# Patient Record
Sex: Male | Born: 1995 | Race: Black or African American | Hispanic: No | Marital: Single | Smoking: Never smoker
Health system: Southern US, Community
[De-identification: ages and names within clinical notes are randomized; demographics above are authoritative.]

## PROBLEM LIST (undated history)

## (undated) HISTORY — PX: OTHER SURGICAL HISTORY: SHX169

---
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
## 2019-08-25 ENCOUNTER — Other Ambulatory Visit: Payer: Self-pay

## 2019-08-25 DIAGNOSIS — Z20822 Contact with and (suspected) exposure to covid-19: Secondary | ICD-10-CM

## 2019-08-26 LAB — NOVEL CORONAVIRUS, NAA: SARS-CoV-2, NAA: DETECTED — AB

## 2019-08-27 ENCOUNTER — Ambulatory Visit: Payer: Self-pay

## 2019-08-27 NOTE — Telephone Encounter (Signed)
Provided covid-19 lab results to Patient, Provided care advice related to Covidd-19 Dx.  Patient voiced Understanding.

## 2019-08-29 ENCOUNTER — Telehealth: Payer: Self-pay | Admitting: Critical Care Medicine

## 2019-08-29 NOTE — Telephone Encounter (Signed)
The patient has a positive COVID test has been symptomatic for about 2 weeks and was tested on the end of his symptom complex.  The patient's rapidly improving and knows he needs to stay in isolation at least until 15 September

## 2020-07-25 ENCOUNTER — Ambulatory Visit (INDEPENDENT_AMBULATORY_CARE_PROVIDER_SITE_OTHER): Payer: 59 | Admitting: Licensed Clinical Social Worker

## 2020-07-25 ENCOUNTER — Other Ambulatory Visit: Payer: Self-pay

## 2020-07-25 DIAGNOSIS — F4321 Adjustment disorder with depressed mood: Secondary | ICD-10-CM

## 2020-07-25 DIAGNOSIS — F338 Other recurrent depressive disorders: Secondary | ICD-10-CM | POA: Diagnosis not present

## 2020-07-26 NOTE — Progress Notes (Signed)
Comprehensive Clinical Assessment (CCA) Note  07/26/2020 Levi Rogers [id number]  Visit Diagnosis:      ICD-10-CM   1. 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 Grief  F43.21      CCA Biopsychosocial  Intake/Chief Complaint:  CCA Intake With Chief Complaint CCA Part Two Date: 07/25/20 CCA Part Two Time: 0953 Chief Complaint/Presenting Problem: uncontrollable urges, transitions Patient's Currently Reported Symptoms/Problems: Mood: irritability, concerned about weight but limited appetite, some difficulty with concentration, limited friends, lack of motivation at times, increase in sexual thoughts and urges, difficulty transitioning to adulthood, feelings of worthlessness, some feelings of hopelessness, doesn't feel like he is a happy person, feels empty at times Individual's Strengths: hardworker, likes to help out, good in Retail buyer and psychology, works well with others Individual's Preferences: prefers peace and quiet, prefers to work by himself, doesn't prefer to be around people too much Individual's Abilities: Good in Retail buyer, is a Production designer, theatre/television/film at work Type of Services Patient Feels Are Needed: Therapy Initial Clinical Notes/Concerns: Symptoms started in middle school when he was bullied about weight, symptoms occur daily for impulses and every few days for "hard depression," symptoms are moderate to severe  Mental Health Symptoms Depression:  Depression: Irritability, Difficulty Concentrating, Worthlessness, Hopelessness  Mania:   N/A  Anxiety:   Anxiety: Irritability, Worrying  Psychosis:  Psychosis: None  Trauma:  Trauma: None  Obsessions:  Obsessions: None  Compulsions:  Compulsions: None  Inattention:  Inattention: None  Hyperactivity/Impulsivity:  Hyperactivity/Impulsivity: N/A  Oppositional/Defiant Behaviors:  Oppositional/Defiant Behaviors: None  Emotional Irregularity:  Emotional Irregularity: None  Other Mood/Personality Symptoms:  Other Mood/Personality  Symptoms: N/A   Mental Status Exam Appearance and self-care  Stature:  Stature: Average  Weight:  Weight: Overweight  Clothing:  Clothing: Casual  Grooming:  Grooming: Normal  Cosmetic use:  Cosmetic Use: None  Posture/gait:  Posture/Gait: Normal  Motor activity:  Motor Activity: Not Remarkable  Sensorium  Attention:  Attention: Normal  Concentration:  Concentration: Normal  Orientation:  Orientation: X5  Recall/memory:  Recall/Memory: Normal  Affect and Mood  Affect:  Affect: Appropriate  Mood:  Mood: Depressed  Relating  Eye contact:  Eye Contact: Fleeting  Facial expression:  Facial Expression: Depressed, Responsive  Attitude toward examiner:  Attitude Toward Examiner: Cooperative  Thought and Language  Speech flow: Speech Flow: Normal  Thought content:  Thought Content: Appropriate to Mood and Circumstances  Preoccupation:   N/A  Hallucinations:  Hallucinations: None  Organization:   Logical  Company secretary of Knowledge:  Fund of Knowledge: Good  Intelligence:  Intelligence: Average  Abstraction:  Abstraction: Normal  Judgement:  Judgement: Fair  Dance movement psychotherapist:  Reality Testing: Adequate  Insight:  Insight: Good  Decision Making:  Decision Making: Normal  Social Functioning  Social Maturity:  Social Maturity: Isolates  Social Judgement:  Social Judgement: Normal  Stress  Stressors:  Stressors: Transitions  Coping Ability:  Coping Ability: Building surveyor Deficits:  Skill Deficits: Self-control  Supports:  Supports: Family     Religion: Religion/Spirituality Are You A Religious Person?: Yes What is Your Religious Affiliation?: Christian How Might This Affect Treatment?: Support in treatment  Leisure/Recreation: Leisure / Recreation Do You Have Hobbies?: Yes Leisure and Hobbies: video games, youtube  Exercise/Diet: Exercise/Diet Do You Exercise?: No Have You Gained or Lost A Significant Amount of Weight in the Past Six Months?: No Do  You Follow a Special Diet?: No Do You Have Any Trouble Sleeping?: No   CCA Employment/Education  Employment/Work Situation: Employment / Work Situation Employment situation: Employed Where is patient currently employed?: [employer] How long has patient been employed?: 6 years Patient's job has been impacted by current illness: No What is the longest time patient has a held a job?: 6 years Where was the patient employed at that time?: [employer] Has patient ever been in the [employer]?: No  Education: Education Is Patient Currently Attending School?: No Last Grade Completed: 12 Name of High School: [employer] Did Garment/textile technologist From [employer]?: Yes Did Theme park manager?:  (Some college) Did Secretary/administrator School?: No Did You Have Any Special Interests In School?: Science, and Psychology Did You Have An Individualized Education Program (IIEP): Yes (seperate rooms for testing) Did You Have Any Difficulty At School?: Yes Were Any Medications Ever Prescribed For These Difficulties?: No Patient's Education Has Been Impacted by Current Illness: No   CCA Family/Childhood History  Family and Relationship History: Family history Marital status: Single Are you sexually active?: No What is your sexual orientation?: Heterosexual Has your sexual activity been affected by drugs, alcohol, medication, or emotional stress?: N/A Does patient have children?: No  Childhood History:  Childhood History By whom was/is the patient raised?: Both parents Additional childhood history information: Both parents were in the home. Father passed away from covid19 last year. Patient describes childhood as good until middle school and then it got bad due to bullying Description of patient's relationship with caregiver when they were a child: Mother: Good, Father: good but got stressful at times Patient's description of current relationship with people who raised him/her: Mother: limited, Father:  deceased How were you disciplined when you got in trouble as a child/adolescent?: spanked a few times Does patient have siblings?: Yes Number of Siblings: 1 Description of patient's current relationship with siblings: Brother, pretty good Did patient suffer any verbal/emotional/physical/sexual abuse as a child?: No Did patient suffer from severe childhood neglect?: No Has patient ever been sexually abused/assaulted/raped as an adolescent or adult?: No Was the patient ever a victim of a crime or a disaster?: No Witnessed domestic violence?: No Has patient been affected by domestic violence as an adult?: No  Child/Adolescent Assessment:     CCA Substance Use  Alcohol/Drug Use: Alcohol / Drug Use Pain Medications: See patient MAR Prescriptions: See patient MAR Over the Counter: See patient MAR History of alcohol / drug use?: No history of alcohol / drug abuse                         ASAM's:  Six Dimensions of Multidimensional Assessment  Dimension 1:  Acute Intoxication and/or Withdrawal Potential:   Dimension 1:  Description of individual's past and current experiences of substance use and withdrawal: None  Dimension 2:  Biomedical Conditions and Complications:   Dimension 2:  Description of patient's biomedical conditions and  complications: None  Dimension 3:  Emotional, Behavioral, or Cognitive Conditions and Complications:  Dimension 3:  Description of emotional, behavioral, or cognitive conditions and complications: None  Dimension 4:  Readiness to Change:  Dimension 4:  Description of Readiness to Change criteria: None  Dimension 5:  Relapse, Continued use, or Continued Problem Potential:  Dimension 5:  Relapse, continued use, or continued problem potential critiera description: None  Dimension 6:  Recovery/Living Environment:  Dimension 6:  Recovery/Iiving environment criteria description: None  ASAM Severity Score: ASAM's Severity Rating Score: 0  ASAM  Recommended Level of Treatment:     Substance  use Disorder (SUD)    Recommendations for Services/Supports/Treatments: Recommendations for Services/Supports/Treatments Recommendations For Services/Supports/Treatments: Individual Therapy, Medication Management  DSM5 Diagnoses: There are no problems to display for this patient.   Patient Centered Plan: Patient is on the following Treatment Plan(s):  Depression   Referrals to Alternative Service(s): Referred to Alternative Service(s):   Place:   Date:   Time:    Referred to Alternative Service(s):   Place:   Date:   Time:    Referred to Alternative Service(s):   Place:   Date:   Time:    Referred to Alternative Service(s):   Place:   Date:   Time:     Bynum Bellows, LCSW

## 2020-08-16 ENCOUNTER — Other Ambulatory Visit: Payer: Self-pay

## 2020-08-16 ENCOUNTER — Ambulatory Visit (INDEPENDENT_AMBULATORY_CARE_PROVIDER_SITE_OTHER): Payer: 59 | Admitting: Licensed Clinical Social Worker

## 2020-08-16 DIAGNOSIS — F338 Other recurrent depressive disorders: Secondary | ICD-10-CM

## 2020-08-17 NOTE — Progress Notes (Signed)
   THERAPIST PROGRESS NOTE  Session Time: 10:00 am-10:45 am  Participation Level: Active  Behavioral Response: CasualAlertDepressed  Type of Therapy: Individual Therapy  Treatment Goals addressed: Coping  Interventions: CBT and Solution Focused  Summary: Levi Rogers is a 24 y.o. male who presents oriented x5 (person, place, situation, time, and object), casually dressed, appropriately groomed, average height, overweight, and cooperative to address mood. Patient has minimal history of medical and mental health treatment. Patient denies suicidal and homicidal ideations. Patient denies psychosis including auditory and visual hallucinations. Patient denies substance abuse. Patient is at low risk for lethality. Patient has a history of low self esteem and isolation but symptoms increased after his father passed away in 04-10-2019 due to complications from COVID19.  Session #1  Physically: Patient is doing ok physically. He has not been exercising. Patient agreed to start walking twice a week at a place he used to go with his family. Patient also agreed to either pack his lunch or find a healthy option to eat when he is at work.  Spiritually/values: No issues identified.  Relationships: Patient has not been interacting with his family a lot. He wants to change that. He feels like he has isolated more since his father passed. Patient feels like a lot of the things he used to do with his father he has lost interest in such as watching anime and eating healthy. Patient doesn't have friends and has never gone on a date. He works a lot and is not always in a position to meet new people.  Emotionally/Mentally/Behavior: Patient's mood is down. He is still not talking to his family like he used to. Patient also noted that with visiting cam sites it provides him a feeling of happiness due to attention/connection that he doesn't get from family. Patient agreed to spend less on cam sites. He was spending almost  $300 each paycheck on cam sites. He feels guilty after and recognizes that he uses a lot of money on these sites with nothing to show for it. Patient is planning on asking his family to go out and go shopping so that he can buy new clothes. This is something that he hasn't done in a long time. Patient also admitted that he feels guilt to feeling like he brought COVID into his home which eventually caused his father to pass away. He hasn't shared this with his family. Patient said that his family would tell him it is not his fault but he still carries some guilt.   Patient engaged in session. He responded well to interventions. Patient continues to meet criteria for Other recurrent depressive disorders and grief. Patient will continue in outpatient therapy due to being the least restrictive service to meet his needs. Patient made minimal progress on his goals.   Suicidal/Homicidal: Negativewithout intent/plan  Therapist Response: Therapist reviewed patient's thoughts and behaviors. Therapist utilized CBT to address mood. Therapist processed patient's feelings to identify triggers for mood and grief. Therapist assisted patient in identifying small steps to take to improve mood including improving physical health, being more social and doing something healthy for himself, and limiting the amount of money that he spends on cam sites to less than $100,  Plan: Return again in 1 weeks.  Diagnosis: Axis I: Other recurrent depressive disorders    Axis II: No diagnosis    Bynum Bellows, LCSW 08/17/2020

## 2020-08-30 ENCOUNTER — Other Ambulatory Visit: Payer: Self-pay

## 2020-08-30 ENCOUNTER — Ambulatory Visit (INDEPENDENT_AMBULATORY_CARE_PROVIDER_SITE_OTHER): Payer: 59 | Admitting: Licensed Clinical Social Worker

## 2020-08-30 DIAGNOSIS — F4321 Adjustment disorder with depressed mood: Secondary | ICD-10-CM | POA: Diagnosis not present

## 2020-08-30 DIAGNOSIS — F338 Other recurrent depressive disorders: Secondary | ICD-10-CM | POA: Diagnosis not present

## 2020-08-30 NOTE — Progress Notes (Signed)
° °  THERAPIST PROGRESS NOTE  Session Time: 9:00 am-9:45 am  Participation Level: Active  Behavioral Response: CasualAlertDepressed  Type of Therapy: Individual Therapy  Treatment Goals addressed: Coping  Interventions: CBT and Solution Focused  Summary: Levi Rogers is a 24 y.o. male who presents oriented x5 (person, place, situation, time, and object), casually dressed, appropriately groomed, average height, overweight, and cooperative to address mood. Patient has minimal history of medical and mental health treatment. Patient denies suicidal and homicidal ideations. Patient denies psychosis including auditory and visual hallucinations. Patient denies substance abuse. Patient is at low risk for lethality. Patient has a history of low self esteem and isolation but symptoms increased after his father passed away in 04/23/2019 due to complications from COVID19.  Session #2  Physically: Patient is doing ok physically. He did not go walking yet. He has been working on cleaning up his eating. Patient still plans to start walking.   Spiritually/values: No issues identified.  Relationships: Patient went to the movies with his brother. He had a good time and plans to do more of it. This week was the anniversary of his father's death. He didn't really feel much. He felt bad about not feeling anything. He felt like he didn't have as much connection with his father as his mother or brother did. He did have some things in common with his father but it was different than his other family members.   Emotionally/Mentally/Behavior: Patient's mood has been ok. He went to cam sites less and felt good about that. Patient was busy at work and has some stress related to work issues but he was able to manage it.    Patient engaged in session. He responded well to interventions. Patient continues to meet criteria for Other recurrent depressive disorders and grief. Patient will continue in outpatient therapy due to  being the least restrictive service to meet his needs. Patient made minimal progress on his goals.   Suicidal/Homicidal: Negativewithout intent/plan  Therapist Response: Therapist reviewed patient's thoughts and behaviors. Therapist utilized CBT to address mood. Therapist processed patient's feelings to identify triggers for mood and grief. Therapist assisted patient in identifying what has gone well since the last session, and recommitted to start getting physically healthy and continuing to be social.   Plan: Return again in 1 weeks.  Diagnosis: Axis I: Other recurrent depressive disorders    Axis II: No diagnosis    Bynum Bellows, LCSW 08/30/2020

## 2020-09-06 ENCOUNTER — Other Ambulatory Visit: Payer: Self-pay

## 2020-09-06 ENCOUNTER — Ambulatory Visit (INDEPENDENT_AMBULATORY_CARE_PROVIDER_SITE_OTHER): Payer: 59 | Admitting: Licensed Clinical Social Worker

## 2020-09-06 DIAGNOSIS — F338 Other recurrent depressive disorders: Secondary | ICD-10-CM

## 2020-09-07 NOTE — Progress Notes (Signed)
Virtual Visit via Video Note  I connected with Levi Rogers on 09/07/20 at 10:00 AM EDT by a video enabled telemedicine application and verified that I am speaking with the correct person using two identifiers.  Location: Patient: home Provider: Office   I discussed the limitations of evaluation and management by telemedicine and the availability of in person appointments. The patient expressed understanding and agreed to proceed.  THERAPIST PROGRESS NOTE  Session Time: 10:00 am-10:45 am  Participation Level: Active  Behavioral Response: CasualAlertDepressed  Type of Therapy: Individual Therapy  Treatment Goals addressed: Coping  Interventions: CBT and Solution Focused  Case Summary: Levi Rogers is a 24 y.o. male who presents oriented x5 (person, place, situation, time, and object), casually dressed, appropriately groomed, average height, overweight, and cooperative to address mood. Patient has minimal history of medical and mental health treatment. Patient denies suicidal and homicidal ideations. Patient denies psychosis including auditory and visual hallucinations. Patient denies substance abuse. Patient is at low risk for lethality. Patient has a history of low self esteem and isolation but symptoms increased after his father passed away in Mar 28, 2019 due to complications from COVID19.  Session #3  Physically: Patient is doing ok physically. He continues to work on his diet and improving his eating. His sleep has been ok.   Spiritually/values: No issues identified.  Relationships: Patient feels disconnected from others. He doesn't feel like he has many close relationships. Patient feels like his family relationships are good but he desires relationships outside of that. Patient is still visiting cam sites but he is spending less money on them. He feels lonely at night at times. Patient agreed to work on interacting with his established relationships more.   Emotionally/Mentally/Behavior: Patient's mood has been ok. Patient has been busy with work. This has been beneficial for him due to having less time to spend ruminating on his thoughts. Patient has reflected on whether he could be the "man of the house." He feels like the "man of the house" needs to know how to fix things, be there for others, and give advice. He doesn't feel like he could be that but after discussion realized that he could learn to do those things such as his father had to learn them.   Patient engaged in session. He responded well to interventions. Patient continues to meet criteria for Other recurrent depressive disorders and grief. Patient will continue in outpatient therapy due to being the least restrictive service to meet his needs. Patient made minimal progress on his goals.   Suicidal/Homicidal: Negativewithout intent/plan  Therapist Response: Therapist reviewed patient's thoughts and behaviors. Therapist utilized CBT to address mood. Therapist processed patient's feelings to identify triggers for mood and grief. Therapist discussed with patient his relationships, increasing communication, and his desire to be "man of the house."    Plan: Return again in 1 weeks.  Diagnosis: Axis I: Other recurrent depressive disorders    Axis II: No diagnosis  I discussed the assessment and treatment plan with the patient. The patient was provided an opportunity to ask questions and all were answered. The patient agreed with the plan and demonstrated an understanding of the instructions.   The patient was advised to call back or seek an in-person evaluation if the symptoms worsen or if the condition fails to improve as anticipated.  I provided 45 minutes of non-face-to-face time during this encounter.  Bynum Bellows, LCSW 09/07/2020

## 2020-09-13 ENCOUNTER — Ambulatory Visit (INDEPENDENT_AMBULATORY_CARE_PROVIDER_SITE_OTHER): Payer: 59 | Admitting: Licensed Clinical Social Worker

## 2020-09-13 ENCOUNTER — Other Ambulatory Visit: Payer: Self-pay

## 2020-09-13 DIAGNOSIS — F338 Other recurrent depressive disorders: Secondary | ICD-10-CM | POA: Diagnosis not present
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
## 2020-09-27 ENCOUNTER — Other Ambulatory Visit: Payer: Self-pay

## 2020-09-27 ENCOUNTER — Ambulatory Visit (INDEPENDENT_AMBULATORY_CARE_PROVIDER_SITE_OTHER): Payer: 59 | Admitting: Licensed Clinical Social Worker

## 2020-09-27 DIAGNOSIS — F338 Other recurrent depressive disorders: Secondary | ICD-10-CM

## 2020-09-28 NOTE — Progress Notes (Signed)
Virtual Visit via Video Note  I connected with Levi Rogers on 09/28/20 at 10:00 AM EDT by a video enabled telemedicine application and verified that I am speaking with the correct person using two identifiers.  Location: Patient: home Provider: Office   I discussed the limitations of evaluation and management by telemedicine and the availability of in person appointments. The patient expressed understanding and agreed to proceed.  THERAPIST PROGRESS NOTE  Session Time: 10:00 am-10:45 am  Participation Level: Active  Behavioral Response: CasualAlertDepressed  Type of Therapy: Individual Therapy  Treatment Goals addressed: Coping  Interventions: CBT and Solution Focused  Case Summary: Levi Rogers is a 24 y.o. male who presents oriented x5 (person, place, situation, time, and object), casually dressed, appropriately groomed, average height, overweight, and cooperative to address mood. Patient has minimal history of medical and mental health treatment. Patient denies suicidal and homicidal ideations. Patient denies psychosis including auditory and visual hallucinations. Patient denies substance abuse. Patient is at low risk for lethality. Patient has a history of low self esteem and isolation but symptoms increased after his father passed away in 04-15-19 due to complications from COVID19.  Session #5  Physically: Patient feels like he is doing well physically. He is working on his eating.  Spiritually/values: No issues identified.  Relationships: Patient had a difficult interaction with his boss at work. He said that his work got upset with him because some things didn't get done the previous night. 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 Patient felt like his boss was treating him like he was stupid. Patient also spoke with his grandmother. He apologized for taking her money and spending it on a cam site.  Patient also noted that he feels like he doesn't know what to say to his mother when she gets upset. After discussion, patient understood that just being a listening ear can be a huge help when people are upset.  Emotionally/Mentally/Behavior: Patient's mood has been down. His interaction at work upset him. He continues to feel lonely. Patient noted that he went on cam sites but he didn't spend money. Patient is going to try to have one day of sobriety from cam sites.   Patient engaged in session. He responded well to interventions. Patient continues to meet criteria for Other recurrent depressive disorders and grief. Patient will continue in outpatient therapy due to being the least restrictive service to meet his needs. Patient made minimal progress on his goals.   Suicidal/Homicidal: Negativewithout intent/plan  Therapist Response: Therapist reviewed patient's thoughts and behaviors. Therapist utilized CBT to address mood. Therapist processed patient's feelings to identify triggers for mood and grief. Therapist discussed with patient his relationships, how to be a support to his mother, and steps to take to reduce cam site use.      Plan: Return again in 1 weeks.  Diagnosis: Axis I: Other recurrent depressive disorders    Axis II: No diagnosis  I discussed the assessment and treatment plan with the patient. The patient was provided an opportunity to ask questions and all were answered. The patient agreed with the plan and demonstrated an understanding of the instructions.   The patient was advised to call back or seek an in-person evaluation if the symptoms worsen or if the condition fails to improve as anticipated.  I provided 45 minutes of non-face-to-face time during this encounter.  Bynum Bellows, LCSW 09/28/2020

## 2020-10-11 ENCOUNTER — Ambulatory Visit (INDEPENDENT_AMBULATORY_CARE_PROVIDER_SITE_OTHER): Payer: 59 | Admitting: Licensed Clinical Social Worker

## 2020-10-11 DIAGNOSIS — F338 Other recurrent depressive disorders: Secondary | ICD-10-CM | POA: Diagnosis not present
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
## 2020-10-25 ENCOUNTER — Ambulatory Visit (INDEPENDENT_AMBULATORY_CARE_PROVIDER_SITE_OTHER): Payer: 59 | Admitting: Licensed Clinical Social Worker

## 2020-10-25 DIAGNOSIS — F338 Other recurrent depressive disorders: Secondary | ICD-10-CM

## 2020-10-26 NOTE — Progress Notes (Signed)
Virtual Visit via Video Note  I connected with Levi Rogers on 10/26/20 at 10:00 AM EST by a video enabled telemedicine application and verified that I am speaking with the correct person using two identifiers.  Location: Patient: home Provider: Office   I discussed the limitations of evaluation and management by telemedicine and the availability of in person appointments. The patient expressed understanding and agreed to proceed.  THERAPIST PROGRESS NOTE  Session Time: 10:00 am-10:45 am  Participation Level: Active  Behavioral Response: CasualAlertDepressed  Type of Therapy: Individual Therapy  Treatment Goals addressed: Coping  Interventions: CBT and Solution Focused  Case Summary: Levi Rogers is a 24 y.o. male who presents oriented x5 (person, place, situation, time, and object), casually dressed, appropriately groomed, average height, overweight, and cooperative to address mood. Patient has minimal history of medical and mental health treatment. Patient denies suicidal and homicidal ideations. Patient denies psychosis including auditory and visual hallucinations. Patient denies substance abuse. Patient is at low risk for lethality. Patient has a history of low self esteem and isolation but symptoms increased after his father passed away in 2019/04/25 due to complications from COVID19.  Session #7  Physically: Patient is doing well physically.  Spiritually/values: No issues identified.  Relationships: Patient is getting along with others at work and his family. Patient thinks about his father often. He feels like his father was the man of the house and patient doesn't feel like he could be the man of the house due to not knowing how to fix things. Patient felt like if his father was here he would tell patient to stay strong, this won't last forever, and he is stronger than this.  Emotionally/Mentally/Behavior: Patient's mood was ok. He has had some stress at work due to people  being let go and having to work more. He visited cam sites and spend more money than he has recently. He feels like the stress, loneliness, etc added to this.   Patient engaged in session. He responded well to interventions. Patient continues to meet criteria for Other recurrent depressive disorders and grief. Patient will continue in outpatient therapy due to being the least restrictive service to meet his needs. Patient made minimal progress on his goals.   Suicidal/Homicidal: Negativewithout intent/plan  Therapist Response: Therapist reviewed patient's thoughts and behaviors. Therapist utilized CBT to address mood. Therapist processed patient's feelings to identify triggers for mood and grief. Therapist discussed with patient his stress at work and it being a trigger for visiting cam sites.       Plan: Return again in 1 weeks.  Diagnosis: Axis I: Other recurrent depressive disorders    Axis II: No diagnosis  I discussed the assessment and treatment plan with the patient. The patient was provided an opportunity to ask questions and all were answered. The patient agreed with the plan and demonstrated an understanding of the instructions.   The patient was advised to call back or seek an in-person evaluation if the symptoms worsen or if the condition fails to improve as anticipated.  I provided 45 minutes of non-face-to-face time during this encounter.  Bynum Bellows, LCSW 10/26/2020

## 2020-11-17 ENCOUNTER — Telehealth (INDEPENDENT_AMBULATORY_CARE_PROVIDER_SITE_OTHER): Payer: 59 | Admitting: Psychiatry

## 2020-11-17 ENCOUNTER — Encounter (HOSPITAL_COMMUNITY): Payer: Self-pay | Admitting: Psychiatry

## 2020-11-17 DIAGNOSIS — F338 Other recurrent depressive disorders: Secondary | ICD-10-CM

## 2020-11-17 DIAGNOSIS — F4321 Adjustment disorder with depressed mood: Secondary | ICD-10-CM

## 2020-11-17 MED ORDER — FLUOXETINE HCL 10 MG PO CAPS: 10.0000 mg | ORAL_CAPSULE | Freq: Every day | ORAL | 2 refills | Status: DC
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
## 2020-11-20 ENCOUNTER — Emergency Department (HOSPITAL_BASED_OUTPATIENT_CLINIC_OR_DEPARTMENT_OTHER): Payer: 59

## 2020-11-20 ENCOUNTER — Encounter (HOSPITAL_BASED_OUTPATIENT_CLINIC_OR_DEPARTMENT_OTHER): Payer: Self-pay | Admitting: *Deleted

## 2020-11-20 ENCOUNTER — Other Ambulatory Visit: Payer: Self-pay

## 2020-11-20 DIAGNOSIS — R059 Cough, unspecified: Secondary | ICD-10-CM | POA: Diagnosis present

## 2020-11-20 DIAGNOSIS — Z20822 Contact with and (suspected) exposure to covid-19: Secondary | ICD-10-CM | POA: Insufficient documentation

## 2020-11-20 DIAGNOSIS — J069 Acute upper respiratory infection, unspecified: Secondary | ICD-10-CM | POA: Insufficient documentation

## 2020-11-20 LAB — RESP PANEL BY RT-PCR (FLU A&B, COVID) ARPGX2
Influenza A by PCR: NEGATIVE
Influenza B by PCR: NEGATIVE
SARS Coronavirus 2 by RT PCR: NEGATIVE

## 2020-11-20 NOTE — ED Triage Notes (Addendum)
C/o cough x 1 week, seen by PMD x 3 days ago no improvement with cough med and pred.

## 2020-11-21 ENCOUNTER — Emergency Department (HOSPITAL_BASED_OUTPATIENT_CLINIC_OR_DEPARTMENT_OTHER)
Admission: EM | Admit: 2020-11-21 | Discharge: 2020-11-21 | Disposition: A | Discharge status: Home / Self Care | Payer: 59 | Attending: Emergency Medicine | Admitting: Emergency Medicine

## 2020-11-21 DIAGNOSIS — J069 Acute upper respiratory infection, unspecified: Secondary | ICD-10-CM

## 2020-11-21 MED ORDER — BENZONATATE 100 MG PO CAPS: 100.0000 mg | ORAL_CAPSULE | Freq: Three times a day (TID) | ORAL | 0 refills | Status: AC

## 2020-11-21 NOTE — Discharge Instructions (Signed)
Take tylenol 2 pills 4 times a day and motrin 4 pills 3 times a day.  Drink plenty of fluids.  Return for worsening shortness of breath, headache, confusion. Follow up with your family doctor.   

## 2020-11-21 NOTE — ED Provider Notes (Signed)
[facility] EMERGENCY DEPARTMENT Provider Note   CSN: [id number] Arrival date & time: 11/20/20  2126     History Chief Complaint  Patient presents with  . Cough    Levi Rogers is a 24 y.o. male.  24 yo M  with a chief complaints of cough and congestion.  Going on for about 10 days now.  No fevers no significant shortness of breath.  No known sick contacts.  No recent travel.  No ear pain no sinus pain or pressure.  The history is provided by the patient.  Cough Associated symptoms: no chest pain, no chills, no eye discharge, no fever, no headaches, no myalgias, no rash and no shortness of breath   Illness Severity:  Moderate Onset quality:  Gradual Duration:  10 days Timing:  Constant Progression:  Worsening Chronicity:  New Associated symptoms: cough   Associated symptoms: no abdominal pain, no chest pain, no congestion, no diarrhea, no fever, no headaches, no myalgias, no rash, no shortness of breath and no vomiting        History reviewed. No pertinent past medical history.  There are no problems to display for this patient.   Past Surgical History:  Procedure Laterality Date  . wisdom teeth removal         No family history on file.  Social History   Tobacco Use  . Smoking status: Never Smoker  Substance Use Topics  . Alcohol use: No  . Drug use: No    Home Medications Prior to Admission medications   Medication Sig Start Date End Date Taking? Authorizing Provider  amoxicillin (AMOXIL) 500 MG capsule Take 500 mg by mouth 3 (three) times daily.    [provider]  benzonatate (TESSALON) 100 MG capsule Take 1 capsule (100 mg total) by mouth every 8 (eight) hours. 11/21/20   Melene Plan, DO  FLUoxetine (PROZAC) 10 MG capsule Take 1 capsule (10 mg total) by mouth daily. Start one a day for 5 days and then start taking two a day 11/17/20 11/17/21  [name], MD  HYDROcodone-acetaminophen (NORCO/VICODIN) 5-325 MG per tablet Take 1  tablet by mouth every 6 (six) hours as needed for pain.    [provider]  ondansetron (ZOFRAN ODT) 8 MG disintegrating tablet 8mg  ODT q8 hours prn nausea 03/31/13   Palumbo, April, MD    Allergies    Patient has no known allergies.  Review of Systems   Review of Systems  Constitutional: Negative for chills and fever.  HENT: Negative for congestion and facial swelling.   Eyes: Negative for discharge and visual disturbance.  Respiratory: Positive for cough. Negative for shortness of breath.   Cardiovascular: Negative for chest pain and palpitations.  Gastrointestinal: Negative for abdominal pain, diarrhea and vomiting.  Musculoskeletal: Negative for arthralgias and myalgias.  Skin: Negative for color change and rash.  Neurological: Negative for tremors, syncope and headaches.  Psychiatric/Behavioral: Negative for confusion and dysphoric mood.    Physical Exam Updated Vital Signs BP (!) 162/99   Pulse 92   Temp 98.1 F (36.7 C) (Oral)   Resp 16   Ht 5\' 11"  (1.803 m)   Wt (!) 142.9 kg   SpO2 99%   BMI 43.93 kg/m   Physical Exam Vitals and nursing note reviewed.  Constitutional:      Appearance: He is well-developed.  HENT:     Head: Normocephalic and atraumatic.     Comments: Swollen turbinates, posterior nasal drip, no noted sinus ttp, tm  normal bilaterally.   Eyes:     Pupils: Pupils are equal, round, and reactive to light.  Neck:     Vascular: No JVD.  Cardiovascular:     Rate and Rhythm: Normal rate and regular rhythm.     Heart sounds: No murmur heard.  No friction rub. No gallop.   Pulmonary:     Effort: No respiratory distress.     Breath sounds: Normal breath sounds. No wheezing.  Abdominal:     General: There is no distension.     Tenderness: There is no abdominal tenderness. There is no guarding or rebound.  Musculoskeletal:        General: Normal range of motion.     Cervical back: Normal range of motion and neck supple.  Skin:     Coloration: Skin is not pale.     Findings: No rash.  Neurological:     Mental Status: He is alert and oriented to person, place, and time.  Psychiatric:        Behavior: Behavior normal.     ED Results / Procedures / Treatments   Labs (all labs ordered are listed, but only abnormal results are displayed) Labs Reviewed  RESP PANEL BY RT-PCR (FLU A&B, COVID) [id number]    EKG None  Radiology DG Chest Portable 1 View  Result Date: 11/20/2020 CLINICAL DATA:  Cough EXAM: PORTABLE CHEST 1 VIEW COMPARISON:  None. FINDINGS: The heart size and mediastinal contours are within normal limits. Both lungs are clear. The visualized skeletal structures are unremarkable. IMPRESSION: No active disease. Electronically Signed   By: Jasmine Pang M.D.   On: 11/20/2020 21:57    Procedures Procedures (including critical care time)  Medications Ordered in ED Medications - No data to display  ED Course  I have reviewed the triage vital signs and the nursing notes.  Pertinent labs & imaging results that were available during my care of the patient were reviewed by me and considered in my medical decision making (see chart for details).    MDM Rules/Calculators/A&P                          24 yo M with a chief complaints of cough congestion.  Going on for about 10 days now.  Well-appearing and nontoxic.  No tachypnea.  Clear lung sounds.  No bacterial source found on exam.  Will discharge home.  PCP follow-up.  12:49 AM:  I have discussed the diagnosis/risks/treatment options with the patient and believe the pt to be eligible for discharge home to follow-up with PCP. We also discussed returning to the ED immediately if new or worsening sx occur. We discussed the sx which are most concerning (e.g., sudden worsening pain, fever, inability to tolerate by mouth) that necessitate immediate return. Medications administered to the patient during their visit and any new prescriptions provided to the patient are  listed below.  Medications given during this visit Medications - No data to display   The patient appears reasonably screen and/or stabilized for discharge and I doubt any other medical condition or other [facility] requiring further screening, evaluation, or treatment in the ED at this time prior to discharge.   Final Clinical Impression(s) / ED Diagnoses Final diagnoses:  Viral URI with cough    Rx / DC Orders ED Discharge Orders         Ordered    benzonatate (TESSALON) 100 MG capsule  Every 8 hours  11/21/20 0043           Melene Plan, DO 11/21/20 8110

## 2020-11-23 ENCOUNTER — Ambulatory Visit (INDEPENDENT_AMBULATORY_CARE_PROVIDER_SITE_OTHER): Payer: 59 | Admitting: Licensed Clinical Social Worker

## 2020-11-23 DIAGNOSIS — F338 Other recurrent depressive disorders: Secondary | ICD-10-CM | POA: Diagnosis not present

## 2020-11-23 NOTE — Progress Notes (Signed)
Virtual Visit via Video Note  I connected with Levi Rogers on 11/23/20 at 10:00 AM EST by a video enabled telemedicine application and verified that I am speaking with the correct person using two identifiers.  Location: Patient: home Provider: Office   I discussed the limitations of evaluation and management by telemedicine and the availability of in person appointments. The patient expressed understanding and agreed to proceed.  THERAPIST PROGRESS NOTE  Session Time: 10:00 am-10:45 am  Participation Level: Active  Behavioral Response: CasualAlertDepressed  Type of Therapy: Individual Therapy  Treatment Goals addressed: Coping  Interventions: CBT and Solution Focused  Case Summary: Levi Rogers is a 24 y.o. male who presents oriented x5 (person, place, situation, time, and object), casually dressed, appropriately groomed, average height, overweight, and cooperative to address mood. Patient has minimal history of medical and mental health treatment. Patient denies suicidal and homicidal ideations. Patient denies psychosis including auditory and visual hallucinations. Patient denies substance abuse. Patient is at low risk for lethality. Patient has a history of low self esteem and isolation but symptoms increased after his father passed away in 2019-04-08 due to complications from COVID19.  Session #8  Physically: Patient has been sick. He went to the hospital due to his coughing. Patient's sleep has been ok but it has been disrupted due to his cough. Patient started taking medication and feels like it is helping. Spiritually/values: No issues identified.  Relationships: Patient is getting along with others at work and his family. He had a difficult day at work a few weeks ago and broke down. He cried and spoke to his mother about how he was feeling. Patient feels like this was a necessary experience to let some of the feelings out but also to be more open with his family.   Emotionally/Mentally/Behavior: Patient's mood was ok. He is feeling better than before. Patient has not been going on cam sites as much but he still has thoughts. He is trying to stay engaged and challenge those thoughts by telling himself it is not necessary to go on a cam site, it costs money, and he feels guilty.   Patient engaged in session. He responded well to interventions. Patient continues to meet criteria for Other recurrent depressive disorders and grief. Patient will continue in outpatient therapy due to being the least restrictive service to meet his needs. Patient made minimal progress on his goals.   Suicidal/Homicidal: Negativewithout intent/plan  Therapist Response: Therapist reviewed patient's thoughts and behaviors. Therapist utilized CBT to address mood. Therapist processed patient's feelings to identify triggers for mood and grief. Therapist discussed with patient what has gone well since last session and managing thoughts of cam sites.        Plan: Return again in 1 weeks.  Diagnosis: Axis I: Other recurrent depressive disorders    Axis II: No diagnosis  I discussed the assessment and treatment plan with the patient. The patient was provided an opportunity to ask questions and all were answered. The patient agreed with the plan and demonstrated an understanding of the instructions.   The patient was advised to call back or seek an in-person evaluation if the symptoms worsen or if the condition fails to improve as anticipated.  I provided 45 minutes of non-face-to-face time during this encounter.  Bynum Bellows, LCSW 11/23/2020

## 2020-12-06 ENCOUNTER — Ambulatory Visit (INDEPENDENT_AMBULATORY_CARE_PROVIDER_SITE_OTHER): Payer: 59 | Admitting: Licensed Clinical Social Worker

## 2020-12-06 ENCOUNTER — Telehealth (HOSPITAL_COMMUNITY): Payer: 59 | Admitting: Psychiatry

## 2020-12-06 DIAGNOSIS — F4321 Adjustment disorder with depressed mood: Secondary | ICD-10-CM

## 2020-12-06 DIAGNOSIS — F338 Other recurrent depressive disorders: Secondary | ICD-10-CM

## 2020-12-06 NOTE — Progress Notes (Signed)
Virtual Visit via Video Note  I connected with Levi Rogers on 12/06/20 at 10:00 AM EST by a video enabled telemedicine application and verified that I am speaking with the correct person using two identifiers.  Location: Patient: home Provider: Office   I discussed the limitations of evaluation and management by telemedicine and the availability of in person appointments. The patient expressed understanding and agreed to proceed.  THERAPIST PROGRESS NOTE  Session Time: 10:00 am-10:45 am  Participation Level: Active  Behavioral Response: CasualAlertDepressed  Type of Therapy: Individual Therapy  Treatment Goals addressed: Coping  Interventions: CBT and Solution Focused  Case Summary: Levi Rogers is a 24 y.o. male who presents oriented x5 (person, place, situation, time, and object), casually dressed, appropriately groomed, average height, overweight, and cooperative to address mood. Patient has minimal history of medical and mental health treatment. Patient denies suicidal and homicidal ideations. Patient denies psychosis including auditory and visual hallucinations. Patient denies substance abuse. Patient is at low risk for lethality. Patient has a history of low self esteem and isolation but symptoms increased after his father passed away in 22-Apr-2019 due to complications from COVID19.  Session #8  Physically: Patient's physical health is stable. Patient is sleeping well and his appetite is stable.  Spiritually/values: No issues identified.  Relationships: Patient feels lonely. He is grieving his father and feels like he has no friends. Patient has some relationships with people at work but no real friends.  Emotionally/Mentally/Behavior: Patient's mood was down. He has had a fluctuating mood. His feelings of loneliness have increased as he has seen people getting together on social media, etc. Patient plans to try new things, go to new places, etc to find new connections. Patient  is working on being more open with his family.   Patient engaged in session. He responded well to interventions. Patient continues to meet criteria for Other recurrent depressive disorders and grief. Patient will continue in outpatient therapy due to being the least restrictive service to meet his needs. Patient made minimal progress on his goals.   Suicidal/Homicidal: Negativewithout intent/plan  Therapist Response: Therapist reviewed patient's thoughts and behaviors. Therapist utilized CBT to address mood. Therapist processed patient's feelings to identify triggers for mood and grief. Therapist discussed with patient his mood, and feelings of loneliness.        Plan: Return again in 1 weeks.  Diagnosis: Axis I: Other recurrent depressive disorders    Axis II: No diagnosis  I discussed the assessment and treatment plan with the patient. The patient was provided an opportunity to ask questions and all were answered. The patient agreed with the plan and demonstrated an understanding of the instructions.   The patient was advised to call back or seek an in-person evaluation if the symptoms worsen or if the condition fails to improve as anticipated.  I provided 45 minutes of non-face-to-face time during this encounter.  Bynum Bellows, LCSW 12/06/2020

## 2020-12-07 ENCOUNTER — Telehealth (INDEPENDENT_AMBULATORY_CARE_PROVIDER_SITE_OTHER): Payer: 59 | Admitting: Psychiatry

## 2020-12-07 ENCOUNTER — Encounter (HOSPITAL_COMMUNITY): Payer: Self-pay | Admitting: Psychiatry

## 2020-12-07 DIAGNOSIS — F4321 Adjustment disorder with depressed mood: Secondary | ICD-10-CM | POA: Diagnosis not present

## 2020-12-07 DIAGNOSIS — F338 Other recurrent depressive disorders: Secondary | ICD-10-CM

## 2020-12-07 MED ORDER — FLUOXETINE HCL 10 MG PO CAPS: 30.0000 mg | ORAL_CAPSULE | Freq: Every day | ORAL | 0 refills | Status: DC

## 2020-12-07 NOTE — Progress Notes (Signed)
[facility] Follow up visit   Patient Identification: Levi Rogers MRN:  [id number] Date of Evaluation:  12/07/2020 Referral Source: Therapist Chief Complaint:  establish care, depression Visit Diagnosis:    ICD-10-CM   1. 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 Grief  F43.21      I connected with [name] on 12/07/20 at 11:00 AM EST by telephone and verified that I am speaking with the correct person using two identifiers.  Location: Patient: home Provider: home office   I discussed the limitations of evaluation and management by telemedicine and the availability of in person appointments. The patient expressed understanding and agreed to proceed.  History of Present Illness: 24 years old currently single African-American male  Initially referred by his therapist for management and establishment of depression care.  Long history of subdued mood complicated with grief with recent death of his dad due to covid Also has been doing CAM videos but did open up to his family last month  Last visit started prozac, helped some, still gets subdued, lonely at times with one time incident of anger while at work No side effects reported  Bullied when young   He misses his dad    Aggravating factors;dad's death.  Bullied when young feels lonely  Modifying factors, family  Duration few years  Severity of depression gotten worse  Suicide attempt denies Says that he used to cut some when he was younger out of anger or depression but family is not aware of that  Denies any recent cutting behavior    Past Psychiatric History: has felt subdued mood since young age, bullied in school  Previous Psychotropic Medications: No     Past Medical History: History reviewed. No pertinent past medical history.  Past Surgical History:  Procedure Laterality Date  . wisdom teeth removal      Family Psychiatric History: depression  Family History: History reviewed. No  pertinent family history.  Social History:   Social History   Socioeconomic History  . Marital status: Single    Spouse name: Not on file  . Number of children: Not on file  . Years of education: Not on file  . Highest education level: Not on file  Occupational History  . Not on file  Tobacco Use  . Smoking status: Never Smoker  . Smokeless tobacco: Not on file  Substance and Sexual Activity  . Alcohol use: No  . Drug use: No  . Sexual activity: Not on file  Other Topics Concern  . Not on file  Social History Narrative  . Not on file   Social Determinants of Health   Financial Resource Strain: Not on file  Food Insecurity: Not on file  Transportation Needs: Not on file  Physical Activity: Not on file  Stress: Not on file  Social Connections: Not on file     Allergies:  No Known Allergies  Metabolic Disorder Labs: No results found for: HGBA1C, MPG No results found for: PROLACTIN No results found for: CHOL, TRIG, HDL, CHOLHDL, VLDL, LDLCALC No results found for: TSH  Therapeutic Level Labs: No results found for: LITHIUM No results found for: CBMZ No results found for: VALPROATE  Current Medications: Current Outpatient Medications  Medication Sig Dispense Refill  . amoxicillin (AMOXIL) 500 MG capsule Take 500 mg by mouth 3 (three) times daily.    . benzonatate (TESSALON) 100 MG capsule Take 1 capsule (100 mg total) by mouth every 8 (eight) hours. 21 capsule 0  .  FLUoxetine (PROZAC) 10 MG capsule Take 3 capsules (30 mg total) by mouth daily. 90 capsule 0  . HYDROcodone-acetaminophen (NORCO/VICODIN) 5-325 MG per tablet Take 1 tablet by mouth every 6 (six) hours as needed for pain.    Marland Kitchen ondansetron (ZOFRAN ODT) 8 MG disintegrating tablet 8mg  ODT q8 hours prn nausea 4 tablet 0   No current facility-administered medications for this visit.     Psychiatric Specialty Exam: Review of Systems  Cardiovascular: Negative for chest pain.  Psychiatric/Behavioral:  Positive for dysphoric mood.    There were no vitals taken for this visit.There is no height or weight on file to calculate BMI.  General Appearance:   Eye Contact:    Speech:  Normal Rate  Volume:  Decreased  Mood:  Somewhat subdued  Affect:  Congruent  Thought Process:  Goal Directed  Orientation:  Full (Time, Place, and Person)  Thought Content:  Rumination  Suicidal Thoughts:  No  Homicidal Thoughts:  No  Memory:  Immediate;   Fair Recent;   Fair  Judgement:  Fair  Insight:  Shallow  Psychomotor Activity:  Decreased  Concentration:  Concentration: Fair and Attention Span: Fair  Recall:  of Knowledge:Good  Language: Good  Akathisia:  No  Handed:    AIMS (if indicated):  not done  Assets:  Desire for Improvement Leisure Time  ADL's:  Intact  Cognition: WNL  Sleep:  Fair   Screenings:   Assessment and Plan: as follows Other recurrent depressive disorder; somewhat subdued but prozac helped some, will increase to 30mg   Grief; gets subdued, continue therapy increase prozac Discussed distraction techniques trying to divert the negative thoughts into something positive or having more activities or healthy activities to do during the daytime he does have a good supportive family  I discussed the assessment and treatment plan with the patient. The patient was provided an opportunity to ask questions and all were answered. The patient agreed with the plan and demonstrated an understanding of the instructions.   The patient was advised to call back or seek an in-person evaluation if the symptoms worsen or if the condition fails to improve as anticipated. FU 4 weeks or earlier if needed I provided 10 - 15   minutes of non-face-to-face time during this encounter.  [employer], MD 12/23/202111:14 AM

## 2020-12-20 ENCOUNTER — Other Ambulatory Visit (HOSPITAL_COMMUNITY): Payer: Self-pay

## 2020-12-20 ENCOUNTER — Ambulatory Visit (INDEPENDENT_AMBULATORY_CARE_PROVIDER_SITE_OTHER): Payer: 59 | Admitting: Licensed Clinical Social Worker

## 2020-12-20 DIAGNOSIS — F338 Other recurrent depressive disorders: Secondary | ICD-10-CM | POA: Diagnosis not present

## 2020-12-20 MED ORDER — FLUOXETINE HCL 10 MG PO CAPS: 30.0000 mg | ORAL_CAPSULE | Freq: Every day | ORAL | 0 refills | Status: DC

## 2020-12-20 NOTE — Progress Notes (Signed)
Virtual Visit via Video Note  I connected with Levi Rogers on 12/20/20 at 10:00 AM EST by a video enabled telemedicine application and verified that I am speaking with the correct person using two identifiers.  Location: Patient: home Provider: Office   I discussed the limitations of evaluation and management by telemedicine and the availability of in person appointments. The patient expressed understanding and agreed to proceed.  THERAPIST PROGRESS NOTE  Session Time: 10:00 am-10:45 am  Participation Level: Active  Behavioral Response: CasualAlertDepressed  Type of Therapy: Individual Therapy   Session #10  Purpose of Session/Treatment Goals addressed: Coping   Levi Rogers "Levi Rogers" will manage mood as evidenced by more balance, increase trust with family, and change the pattern of his life for 5 out of 7 days for 60 days.  Interventions: CBT and Solution Focused  Therapist utilized CBT and Solution focused brief therapy to address mood and anxiety. Therapist provided empathy as client identified concerns related to loss of independence, income, and trust in relationships due to his impulsive behavior and depressive symptoms. Therapist reviewed patient's precursors, triggers, and patterns of depression to explore how he experiences and is affected by depression. Therapist assisted patient in developing a plan to cultivate happiness by identifying how the "old Levi Rogers" acted and how he wants the "new Levi Rogers" to be.  Effectiveness: Patient presented oriented x5 (person, place, situation, time, and object). Patient had a pleasant tone and was active in conversation. Patient noted that he had some difficulty times during the holidays. 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 Patient was frustrated and "yelled" at his boss and expressed his feelings of his GM's lack of care, lack of respect, etc. Patient noted that the GM  is no longer working there and he has applied for the job. Patient felt overwhelmed at work, and also was not taking his medication as prescribed. He felt down and had thoughts of not getting better. He went on a cam site, spent his money, and got his grandmother's debit card and charged money on her card for adult entertainment. His mother and grandmother were disappointed in him. Patient felt even worse and had passive thoughts of SI. He denied a plan. Patient also had thoughts of self injury but didn't engage. He feels like he is disappointing everyone, including therapist. After discussion, patient understood that these were just thoughts and not the truth. Patient wants to build trust with his family and get better. He feels like he doesn't know himself anymore due to his behavior and depression. Patient continues to grieve his father whom he lost due to COVID in 2020. Patient was able to identify that his "old self" was more social, went places, watched anime, worked on his diet, and went out more. Patient also noted that he wants his "new self" to be like this as well. Patient understood that cultivating happiness in his life is like taking care of a plant. He can't just set a plant in the corner and expect it to thrive, that he has to nuture and cultivate happiness by taking steps. Patient is going to take his medication as prescribed and start to engage in behaviors his "old" and "new" self would engage in to increase happiness and reduce symptoms of depression.   Patient engaged in session. He responded well to interventions. Patient continues to meet criteria for Other recurrent depressive disorders and grief. Patient will continue in  outpatient therapy due to being the least restrictive service to meet his needs. Patient made minimal progress on his goals.   Suicidal/Homicidal: Negativewithout intent/plan  Plan: Return again in 1 weeks.  Diagnosis: Axis I: Other recurrent depressive  disorders    Axis II: No diagnosis  I discussed the assessment and treatment plan with the patient. The patient was provided an opportunity to ask questions and all were answered. The patient agreed with the plan and demonstrated an understanding of the instructions.   The patient was advised to call back or seek an in-person evaluation if the symptoms worsen or if the condition fails to improve as anticipated.  I provided 45 minutes of non-face-to-face time during this encounter.  Bynum Bellows, LCSW 12/20/2020

## 2021-01-03 ENCOUNTER — Ambulatory Visit (INDEPENDENT_AMBULATORY_CARE_PROVIDER_SITE_OTHER): Payer: 59 | Admitting: Licensed Clinical Social Worker

## 2021-01-03 DIAGNOSIS — F338 Other recurrent depressive disorders: Secondary | ICD-10-CM | POA: Diagnosis not present

## 2021-01-03 DIAGNOSIS — F4321 Adjustment disorder with depressed mood: Secondary | ICD-10-CM

## 2021-01-03 NOTE — Progress Notes (Signed)
Virtual Visit via Video Note  I connected with Levi Rogers on 01/03/21 at 10:00 AM EST by a video enabled telemedicine application and verified that I am speaking with the correct person using two identifiers.  Location: Patient: home Provider: Office   I discussed the limitations of evaluation and management by telemedicine and the availability of in person appointments. The patient expressed understanding and agreed to proceed.  THERAPIST PROGRESS NOTE  Session Time: 10:00 am-10:27 am  Type of Therapy: Individual Therapy   Session #11  Purpose of Session/Treatment Goals addressed:   Levi "[name]" will manage mood as evidenced by more balance, increase trust with family, and change the pattern of his life for 5 out of 7 days for 60 days.  Interventions:  Therapist utilized CBT and Solution focused brief therapy to address mood and anxiety. Therapist provided a space of support and empathy to patient as he shared his thoughts and feelings. Therapist identify specific behaviors that he has engaged in that have improved his mood at times. Therapist provided psychoeducation on CBT and the importance of identifying thoughts that are associated with depression. Therapist gave patient homework to track his mood and to use daily CBT coping skills/activities that therapist emailed to patient.   Effectiveness: Patient presented oriented x5 (person, place, situation, time, and object). Patient was alert, engaged in session but spoke limitedly through the session. Patient noted that he has been busy the last few weeks with work, shoveling snow, etc. He felt good with being productive. Patient has had thoughts about his deceased father but noted they are positive thoughts and reminded him of happier times. Patient has not been on cam sights like he has before. Patient still has difficulty with depression. He has difficulty with identifying why or expressing his feelings to others. Patient  understood the importance of identifying thoughts that lead to depression. He agreed to identify and track his thoughts.   Patient engaged in session. He responded well to interventions. Patient continues to meet criteria for Other recurrent depressive disorders and grief. Patient will continue in outpatient therapy due to being the least restrictive service to meet his needs. Patient made minimal progress on his goals.   Suicidal/Homicidal: Negativewithout intent/plan  Plan: Return again in 2-4 weeks. Patient agreed to track his mood.   Diagnosis: Axis I: Other recurrent depressive disorders    Axis II: No diagnosis  I discussed the assessment and treatment plan with the patient. The patient was provided an opportunity to ask questions and all were answered. The patient agreed with the plan and demonstrated an understanding of the instructions.   The patient was advised to call back or seek an in-person evaluation if the symptoms worsen or if the condition fails to improve as anticipated.  I provided 27 minutes of non-face-to-face time during this encounter.  Bynum Bellows, LCSW 01/03/2021

## 2021-01-11 ENCOUNTER — Encounter (HOSPITAL_COMMUNITY): Payer: Self-pay | Admitting: Psychiatry

## 2021-01-11 ENCOUNTER — Telehealth (INDEPENDENT_AMBULATORY_CARE_PROVIDER_SITE_OTHER): Payer: 59 | Admitting: Psychiatry

## 2021-01-11 DIAGNOSIS — F338 Other recurrent depressive disorders: Secondary | ICD-10-CM | POA: Diagnosis not present

## 2021-01-11 DIAGNOSIS — F4321 Adjustment disorder with depressed mood: Secondary | ICD-10-CM | POA: Diagnosis not present

## 2021-01-11 NOTE — Progress Notes (Signed)
[facility] Follow up visit   Patient Identification: Levi Rogers MRN:  [id number] Date of Evaluation:  01/11/2021 Referral Source: Therapist Chief Complaint: follow up  depression Visit Diagnosis:    ICD-10-CM   1. 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 Grief  F43.21      Virtual Visit via Telephone Note  I connected with Levi Rogers on 01/11/21 at 10:00 AM EST by telephone and verified that I am speaking with the correct person using two identifiers.  Location: Patient: home  Provider: home office   I discussed the limitations, risks, security and privacy concerns of performing an evaluation and management service by telephone and the availability of in person appointments. I also discussed with the patient that there may be a patient responsible charge related to this service. The patient expressed understanding and agreed to proceed.     I discussed the assessment and treatment plan with the patient. The patient was provided an opportunity to ask questions and all were answered. The patient agreed with the plan and demonstrated an understanding of the instructions.   The patient was advised to call back or seek an in-person evaluation if the symptoms worsen or if the condition fails to improve as anticipated.  I provided 10  minutes of non-face-to-face time during this encounter.   [name], MD   History of Present Illness: 25 years old currently single African-American male  Initially referred by his therapist for management and establishment of depression care.  Doing some better, less CAM video visits, family supportive Still gets some sad days but doesn't last for days  No side effects reported  Bullied when young   He misses his dad    Aggravating factors;dad's death.  Bullied when young feels lonely  Modifying factors, family  Duration few years  Severity of depression better   Past Psychiatric History: has felt subdued mood since  young age, bullied in school  Previous Psychotropic Medications: No     Past Medical History: No past medical history on file.  Past Surgical History:  Procedure Laterality Date  . wisdom teeth removal      Family Psychiatric History: depression  Family History: No family history on file.  Social History:   Social History   Socioeconomic History  . Marital status: Single    Spouse name: Not on file  . Number of children: Not on file  . Years of education: Not on file  . Highest education level: Not on file  Occupational History  . Not on file  Tobacco Use  . Smoking status: Never Smoker  . Smokeless tobacco: Not on file  Substance and Sexual Activity  . Alcohol use: No  . Drug use: No  . Sexual activity: Not on file  Other Topics Concern  . Not on file  Social History Narrative  . Not on file   Social Determinants of Health   Financial Resource Strain: Not on file  Food Insecurity: Not on file  Transportation Needs: Not on file  Physical Activity: Not on file  Stress: Not on file  Social Connections: Not on file     Allergies:  No Known Allergies  Metabolic Disorder Labs: No results found for: HGBA1C, MPG No results found for: PROLACTIN No results found for: CHOL, TRIG, HDL, CHOLHDL, VLDL, LDLCALC No results found for: TSH  Therapeutic Level Labs: No results found for: LITHIUM No results found for: CBMZ No results found for: VALPROATE  Current Medications: Current Outpatient Medications  Medication Sig Dispense Refill  . amoxicillin (AMOXIL) 500 MG capsule Take 500 mg by mouth 3 (three) times daily.    . benzonatate (TESSALON) 100 MG capsule Take 1 capsule (100 mg total) by mouth every 8 (eight) hours. 21 capsule 0  . FLUoxetine (PROZAC) 10 MG capsule Take 3 capsules (30 mg total) by mouth daily. 270 capsule 0  . HYDROcodone-acetaminophen (NORCO/VICODIN) 5-325 MG per tablet Take 1 tablet by mouth every 6 (six) hours as needed for pain.    Marland Kitchen  ondansetron (ZOFRAN ODT) 8 MG disintegrating tablet 8mg  ODT q8 hours prn nausea 4 tablet 0   No current facility-administered medications for this visit.     Psychiatric Specialty Exam: Review of Systems  Cardiovascular: Negative for chest pain.  Psychiatric/Behavioral: Negative for agitation and suicidal ideas.    There were no vitals taken for this visit.There is no height or weight on file to calculate BMI.  General Appearance:   Eye Contact:    Speech:  Normal Rate  Volume:  Decreased  Mood:  fair  Affect:  Congruent  Thought Process:  Goal Directed  Orientation:  Full (Time, Place, and Person)  Thought Content:  Rumination  Suicidal Thoughts:  No  Homicidal Thoughts:  No  Memory:  Immediate;   Fair Recent;   Fair  Judgement:  Fair  Insight:  Shallow  Psychomotor Activity:  Decreased  Concentration:  Concentration: Fair and Attention Span: Fair  Recall:  of Knowledge:Good  Language: Good  Akathisia:  No  Handed:    AIMS (if indicated):  not done  Assets:  Desire for Improvement Leisure Time  ADL's:  Intact  Cognition: WNL  Sleep:  Fair   Screenings:   Assessment and Plan: as follows Other recurrent depressive disorder; improved, continue prozac 30mg   Grief; some better, continue therapy and prozac at 30mg    Fu 67m. Has meds for now , MD 1/27/202210:11 AM

## 2021-01-24 ENCOUNTER — Ambulatory Visit (INDEPENDENT_AMBULATORY_CARE_PROVIDER_SITE_OTHER): Payer: 59 | Admitting: Licensed Clinical Social Worker

## 2021-01-24 DIAGNOSIS — F4321 Adjustment disorder with depressed mood: Secondary | ICD-10-CM | POA: Diagnosis not present

## 2021-01-24 DIAGNOSIS — F338 Other recurrent depressive disorders: Secondary | ICD-10-CM

## 2021-01-25 NOTE — Progress Notes (Signed)
Virtual Visit via Video Note  I connected with Levi Rogers on 01/25/21 at 10:00 AM EST by a video enabled telemedicine application and verified that I am speaking with the correct person using two identifiers.  Location: Patient: home Provider: Office   I discussed the limitations of evaluation and management by telemedicine and the availability of in person appointments. The patient expressed understanding and agreed to proceed.  THERAPIST PROGRESS NOTE  Session Time: 10:00 am-10:30 am  Type of Therapy: Individual Therapy   Session #12  Purpose of Session/Treatment Goals addressed:   Levi "[name]" will manage mood as evidenced by more balance, increase trust with family, and change the pattern of his life for 5 out of 7 days for 60 days.  Interventions:  Therapist utilized CBT and Solution focused brief therapy to address mood and anxiety. Therapist allowed space for patient to share his thoughts and feelings during session without judgment and with support. Therapist had patient identify pre-session changes. Therapist explored patient's interactions with his family, what is going well, and what he feels like he continues to struggle with. Therapist continued to work with patient's grief and going through the process of grief.   Effectiveness: Patient was oriented x5 (person, place, situation, time, and object). Patient was alert, engaged, and pleasant during session. Patient reported that his mood has been stable. 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 He has been using Cam sites much less and saving money. Patient continues to miss his father especially on holidays and major events. Patient also feels at a loss for supporting his mother at times. He is coping with his own grief but feels like he should be able to provide wisdom to his mother, etc.   Patient engaged in session. He responded well to interventions. Patient continues to  meet criteria for Other recurrent depressive disorders and grief. Patient will continue in outpatient therapy due to being the least restrictive service to meet his needs. Patient made moderate progress on his goals.   Suicidal/Homicidal: Negativewithout intent/plan  Plan: Return again in 2-4 weeks.   Diagnosis: Axis I: Other recurrent depressive disorders    Axis II: No diagnosis  I discussed the assessment and treatment plan with the patient. The patient was provided an opportunity to ask questions and all were answered. The patient agreed with the plan and demonstrated an understanding of the instructions.   The patient was advised to call back or seek an in-person evaluation if the symptoms worsen or if the condition fails to improve as anticipated.  I provided 30 minutes of non-face-to-face time during this encounter.  Bynum Bellows, LCSW 01/25/2021

## 2021-02-08 ENCOUNTER — Ambulatory Visit (INDEPENDENT_AMBULATORY_CARE_PROVIDER_SITE_OTHER): Payer: 59 | Admitting: Licensed Clinical Social Worker

## 2021-02-08 DIAGNOSIS — F338 Other recurrent depressive disorders: Secondary | ICD-10-CM

## 2021-02-08 NOTE — Progress Notes (Signed)
Virtual Visit via Video Note  I connected with Levi Rogers on 02/08/21 at 10:00 AM EST by a video enabled telemedicine application and verified that I am speaking with the correct person using two identifiers.  Location: Patient: home Provider: Office   I discussed the limitations of evaluation and management by telemedicine and the availability of in person appointments. The patient expressed understanding and agreed to proceed.  THERAPIST PROGRESS NOTE  Session Time: 10:00 am-10:40 am  Type of Therapy: Individual Therapy   Session #13  Purpose of Session/Treatment Goals addressed:   Scout "[name]" will manage mood as evidenced by more balance, increase trust with family, and change the pattern of his life for 5 out of 7 days for 60 days.  Interventions:  Therapist utilized CBT and Solution focused brief therapy to address mood. Therapist provided space for patient to openly share his thoughts and feelings without fear of judgement. Therapist had patient verbalize situations that got him angry and processed them to identify triggers. Therapist worked with patient to identify ways to calm himself down and work on seeing others point of view when frustrated.    Effectiveness: Patient was oriented x5 (person, place, situation, time, and object). Patient was alert, engaged, and pleasant during session. Patient reported that his depression and anxiety have been stable. Patient noted an increase in irritability. Patient described two situations where his anger got the best of him including with a neighbor and at work. Patient felt powerless and not heard in each situation. Patient felt like he wasn't being listen to. Patient feels like he needs to focus on trying to understand others when he starts to get frustrated. He realizes that others intentions may not be what he is perceiving.   Patient engaged in session. He responded well to interventions. Patient continues to meet criteria for  Other recurrent depressive disorders and grief. Patient will continue in outpatient therapy due to being the least restrictive service to meet his needs. Patient made moderate progress on his goals.   Suicidal/Homicidal: Negativewithout intent/plan  Plan: Return again in 2-4 weeks.   Diagnosis: Axis I: Other recurrent depressive disorders    Axis II: No diagnosis  I discussed the assessment and treatment plan with the patient. The patient was provided an opportunity to ask questions and all were answered. The patient agreed with the plan and demonstrated an understanding of the instructions.   The patient was advised to call back or seek an in-person evaluation if the symptoms worsen or if the condition fails to improve as anticipated.  I provided 40 minutes of non-face-to-face time during this encounter.  Bynum Bellows, LCSW 02/08/2021

## 2021-03-07 ENCOUNTER — Ambulatory Visit (INDEPENDENT_AMBULATORY_CARE_PROVIDER_SITE_OTHER): Payer: 59 | Admitting: Licensed Clinical Social Worker

## 2021-03-07 DIAGNOSIS — F338 Other recurrent depressive disorders: Secondary | ICD-10-CM | POA: Diagnosis not present

## 2021-03-07 NOTE — Progress Notes (Signed)
Virtual Visit via Video Note  I connected with Levi Rogers on 03/07/21 at 10:00 AM EDT by a video enabled telemedicine application and verified that I am speaking with the correct person using two identifiers.  Location: Patient: home Provider: Office   I discussed the limitations of evaluation and management by telemedicine and the availability of in person appointments. The patient expressed understanding and agreed to proceed.  THERAPIST PROGRESS NOTE  Session Time: 10:00 am-10:40 am  Type of Therapy: Individual Therapy   Session #14  Purpose of Session/Treatment Goals addressed:   Levi "[name]" will manage mood as evidenced by more balance, increase trust with family, and change the pattern of his life for 5 out of 7 days for 60 days.  Interventions:  Therapist utilized CBT and Solution focused brief therapy to address mood and anxiety. Therapist provided support and empathy in a non judgmental space to patient as he shared his thoughts and feelings. Therapist had patient identify what has gone well since the last session. Therapist had patient identify triggers for anxiety/worry. Therapist taught aspects of mindfulness including staying present to manage anxious thoughts.     Effectiveness: Patient was oriented x5 (person, place, situation, time, and object). Patient was alert, engaged, and pleasant during session. Patient feels like things have been going well for him. He denies depressed mood. Patient admits to some anxiety but mainly about his mother's health. Patient noted she has had some GI issues and has to have some upcoming procedures. It makes him concerned he could lose another parent. Patient doesn't think he could function if this happened. Patient understood that he needs to stay present and not get ahead of himself with his thoughts. Patient has been going on Cam sites less and spending less money on them. He is staying busy at work.  Patient engaged in session.  He responded well to interventions. Patient continues to meet criteria for Other recurrent depressive disorders and grief. Patient will continue in outpatient therapy due to being the least restrictive service to meet his needs. Patient made moderate progress on his goals.   Suicidal/Homicidal: Negativewithout intent/plan  Plan: Return again in 2-4 weeks.   Diagnosis: Axis I: Other recurrent depressive disorders    Axis II: No diagnosis  I discussed the assessment and treatment plan with the patient. The patient was provided an opportunity to ask questions and all were answered. The patient agreed with the plan and demonstrated an understanding of the instructions.   The patient was advised to call back or seek an in-person evaluation if the symptoms worsen or if the condition fails to improve as anticipated.  I provided 40 minutes of non-face-to-face time during this encounter.  Bynum Bellows, LCSW 03/07/2021

## 2021-03-08 ENCOUNTER — Telehealth (INDEPENDENT_AMBULATORY_CARE_PROVIDER_SITE_OTHER): Payer: 59 | Admitting: Psychiatry

## 2021-03-08 ENCOUNTER — Encounter (HOSPITAL_COMMUNITY): Payer: Self-pay | Admitting: Psychiatry

## 2021-03-08 DIAGNOSIS — F338 Other recurrent depressive disorders: Secondary | ICD-10-CM | POA: Diagnosis not present

## 2021-03-08 DIAGNOSIS — F4321 Adjustment disorder with depressed mood: Secondary | ICD-10-CM

## 2021-03-08 MED ORDER — FLUOXETINE HCL 10 MG PO CAPS: 30.0000 mg | ORAL_CAPSULE | Freq: Every day | ORAL | 0 refills | Status: DC

## 2021-03-08 NOTE — Progress Notes (Signed)
[facility] Follow up visit   Patient Identification: Levi Rogers MRN:  [id number] Date of Evaluation:  03/08/2021 Referral Source: Therapist Chief Complaint: follow up  depression Visit Diagnosis:    ICD-10-CM   1. 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 Grief  F43.21      Virtual Visit via Video Note  I connected with Levi Rogers on 03/08/21 at 10:00 AM EDT by a video enabled telemedicine application and verified that I am speaking with the correct person using two identifiers.  Location: Patient: home Provider: office   I discussed the limitations of evaluation and management by telemedicine and the availability of in person appointments. The patient expressed understanding and agreed to proceed.      I discussed the assessment and treatment plan with the patient. The patient was provided an opportunity to ask questions and all were answered. The patient agreed with the plan and demonstrated an understanding of the instructions.   The patient was advised to call back or seek an in-person evaluation if the symptoms worsen or if the condition fails to improve as anticipated.  I provided 12 minutes of non-face-to-face time during this encounter.     History of Present Illness: 25 years old currently single African-American male  Initially referred by his therapist for management and establishment of depression care.  Doing better, therapy helping and he is working on distraction from worries Job stress better and communicating with brother more Worries related to mom health, Grief improving and tolerating prozac at 30mg   Aggravating factors;dad's death .  Bullied when young feels lonely  Modifying factors,family  Duration few years  Severity of depression better   Past Psychiatric History: has felt subdued mood since young age, bullied in school  Previous Psychotropic Medications: No     Past Medical History: History reviewed. No pertinent past  medical history.  Past Surgical History:  Procedure Laterality Date  . wisdom teeth removal      Family Psychiatric History: depression  Family History: History reviewed. No pertinent family history.  Social History:   Social History   Socioeconomic History  . Marital status: Single    Spouse name: Not on file  . Number of children: Not on file  . Years of education: Not on file  . Highest education level: Not on file  Occupational History  . Not on file  Tobacco Use  . Smoking status: Never Smoker  . Smokeless tobacco: Not on file  Substance and Sexual Activity  . Alcohol use: No  . Drug use: No  . Sexual activity: Not on file  Other Topics Concern  . Not on file  Social History Narrative  . Not on file   Social Determinants of Health   Financial Resource Strain: Not on file  Food Insecurity: Not on file  Transportation Needs: Not on file  Physical Activity: Not on file  Stress: Not on file  Social Connections: Not on file     Allergies:  No Known Allergies  Metabolic Disorder Labs: No results found for: HGBA1C, MPG No results found for: PROLACTIN No results found for: CHOL, TRIG, HDL, CHOLHDL, VLDL, LDLCALC No results found for: TSH  Therapeutic Level Labs: No results found for: LITHIUM No results found for: CBMZ No results found for: VALPROATE  Current Medications: Current Outpatient Medications  Medication Sig Dispense Refill  . amoxicillin (AMOXIL) 500 MG capsule Take 500 mg by mouth 3 (three) times daily.    . benzonatate (TESSALON) 100 MG  capsule Take 1 capsule (100 mg total) by mouth every 8 (eight) hours. 21 capsule 0  . FLUoxetine (PROZAC) 10 MG capsule Take 3 capsules (30 mg total) by mouth daily. 270 capsule 0  . HYDROcodone-acetaminophen (NORCO/VICODIN) 5-325 MG per tablet Take 1 tablet by mouth every 6 (six) hours as needed for pain.    Marland Kitchen ondansetron (ZOFRAN ODT) 8 MG disintegrating tablet 8mg  ODT q8 hours prn nausea 4 tablet 0   No  current facility-administered medications for this visit.     Psychiatric Specialty Exam: Review of Systems  Cardiovascular: Negative for chest pain.  Psychiatric/Behavioral: Negative for agitation and suicidal ideas.    There were no vitals taken for this visit.There is no height or weight on file to calculate BMI.  General Appearance:   Eye Contact:    Speech:  Normal Rate  Volume:  Decreased  Mood:  fair  Affect:  Congruent  Thought Process:  Goal Directed  Orientation:  Full (Time, Place, and Person)  Thought Content:  Rumination  Suicidal Thoughts:  No  Homicidal Thoughts:  No  Memory:  Immediate;   Fair Recent;   Fair  Judgement:  Fair  Insight:  Shallow  Psychomotor Activity:  Decreased  Concentration:  Concentration: Fair and Attention Span: Fair  Recall:  of Knowledge:Good  Language: Good  Akathisia:  No  Handed:    AIMS (if indicated):  not done  Assets:  Desire for Improvement Leisure Time  ADL's:  Intact  Cognition: WNL  Sleep:  Fair   Screenings: Flowsheet Row [facility]  C-SSRS RISK CATEGORY No Risk      Assessment and Plan: as follows  Prior documentation reviewed  Other recurrent depressive disorder; doing better, continue prozac 30mg    Grief; manageable and distracts , therapy helping, continue prozac 30mg   Fu 45m. In office Refill sent , MD 3/24/202210:18 AM

## 2021-03-29 ENCOUNTER — Ambulatory Visit (HOSPITAL_COMMUNITY): Payer: 59 | Admitting: Licensed Clinical Social Worker

## 2021-04-25 ENCOUNTER — Ambulatory Visit (HOSPITAL_COMMUNITY): Payer: 59 | Admitting: Licensed Clinical Social Worker

## 2021-05-16 ENCOUNTER — Ambulatory Visit (INDEPENDENT_AMBULATORY_CARE_PROVIDER_SITE_OTHER): Payer: 59 | Admitting: Licensed Clinical Social Worker

## 2021-05-16 DIAGNOSIS — F338 Other recurrent depressive disorders: Secondary | ICD-10-CM

## 2021-05-17 NOTE — Progress Notes (Signed)
Virtual Visit via Video Note  I connected with Levi Rogers on 05/17/21 at 11:00 AM EDT by a video enabled telemedicine application and verified that I am speaking with the correct person using two identifiers.  Location: Patient: home Provider: Office   I discussed the limitations of evaluation and management by telemedicine and the availability of in person appointments. The patient expressed understanding and agreed to proceed.  THERAPIST PROGRESS NOTE  Session Time: 11:00 am-11:40 am  Type of Therapy: Individual Therapy   Session #15  Purpose of Session/Treatment Goals addressed:   Levi "[name]" will manage mood as evidenced by more balance, increase trust with family, and change the pattern of his life for 5 out of 7 days for 60 days.  Interventions:  Therapist utilized CBT and Solution focused brief therapy to address mood and anxiety. Therapist provided support and space for patient to open up about his thoughts and feelings without judgment. Therapist explored patient's recent thoughts and behaviors. Therapist had patient verbalize his addictive behavior and the consequences of his behavior.      Effectiveness: Patient was oriented x5 (person, place, situation, time, and object). Patient was casually dressed, and appropriately groomed. Patient was alert, engaged, cooperative, and feeling down. 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 He has been spending all of his money and has has even taken money from his job to buy "time" on the cam sites. Patient admits his addiction is out of control. He didn't feel comfortable talking about it but understood that it has to come out of the darkness into the light to improve. Patient gets on the sites for sexual gratification but also feels like he has a connection with some of the models. They remember him and laugh at his jokes. Patient agreed to not take money from his work again to avoid  consequences. He is going to try to reduce the amount of time on camsites.   Patient engaged in session. He responded well to interventions. Patient continues to meet criteria for Other recurrent depressive disorders and grief. Patient will continue in outpatient therapy due to being the least restrictive service to meet his needs. Patient made moderate progress on his goals.   Suicidal/Homicidal: Negativewithout intent/plan  Plan: Return again in 2-4 weeks.   Diagnosis: Axis I: Other recurrent depressive disorders    Axis II: No diagnosis  I discussed the assessment and treatment plan with the patient. The patient was provided an opportunity to ask questions and all were answered. The patient agreed with the plan and demonstrated an understanding of the instructions.   The patient was advised to call back or seek an in-person evaluation if the symptoms worsen or if the condition fails to improve as anticipated.  I provided 40 minutes of non-face-to-face time during this encounter.  Levi Bellows, LCSW 05/17/2021

## 2021-05-29 ENCOUNTER — Ambulatory Visit (HOSPITAL_COMMUNITY): Payer: 59 | Admitting: Psychiatry

## 2021-06-07 ENCOUNTER — Ambulatory Visit (INDEPENDENT_AMBULATORY_CARE_PROVIDER_SITE_OTHER): Payer: 59 | Admitting: Psychiatry

## 2021-06-07 ENCOUNTER — Encounter (HOSPITAL_COMMUNITY): Payer: Self-pay | Admitting: Psychiatry

## 2021-06-07 VITALS — BP 124/80 | Temp 98.6°F | Ht 69.5 in | Wt 391.0 lb

## 2021-06-07 DIAGNOSIS — F338 Other recurrent depressive disorders: Secondary | ICD-10-CM | POA: Diagnosis not present

## 2021-06-07 DIAGNOSIS — F4321 Adjustment disorder with depressed mood: Secondary | ICD-10-CM

## 2021-06-07 MED ORDER — BUPROPION HCL ER (SR) 100 MG PO TB12: 100.0000 mg | ORAL_TABLET | Freq: Two times a day (BID) | ORAL | 1 refills | Status: DC

## 2021-06-07 MED ORDER — BUPROPION HCL ER (SR) 100 MG PO TB12: 100.0000 mg | ORAL_TABLET | Freq: Every day | ORAL | 1 refills | Status: DC

## 2021-06-07 MED ORDER — FLUOXETINE HCL 20 MG PO CAPS: 20.0000 mg | ORAL_CAPSULE | Freq: Every day | ORAL | 1 refills | Status: DC

## 2021-06-07 NOTE — Progress Notes (Signed)
[facility] Follow up visit   Patient Identification: Levi Rogers MRN:  [id number] Date of Evaluation:  06/07/2021 Referral Source: Therapist Chief Complaint: follow up  depression Visit Diagnosis:    ICD-10-CM   1. Other recurrent depressive disorders (HCC)  F33.8     2. Grief  F43.21          History of Present Illness: 25 years old currently single African-American male  Initially referred by his therapist for management and establishment of depression care.  Patient is doing somewhat better last visit but now endorsing depression feeling low poor self-esteem he does work but still feels down he has spent money on videos again.  He has now allowed mom to take care of his money so that has helped somewhat in the last 1 month  Still thinks about his dad and makes him feel guilt that he is not there or he died  He is doing therapy  Apparently has not been taking the medication regularly and last visit we have increased the Prozac to 30 mg he has not been taking regularly remains somewhat noncompliant so we discussed in detail about compliance to help the depression   Worries related to mom health,  Aggravating factors;dad's death .  Bullied when young, feels lonely  Modifying factors,mom Duration few years  Severity of depression better   Past Psychiatric History: has felt subdued mood since young age, bullied in school  Previous Psychotropic Medications: No     Past Medical History: History reviewed. No pertinent past medical history.  Past Surgical History:  Procedure Laterality Date   wisdom teeth removal      Family Psychiatric History: depression  Family History:  Family History  Problem Relation Age of Onset   Anxiety disorder Mother    Depression Mother     Social History:   Social History   Socioeconomic History   Marital status: Single    Spouse name: Not on file   Number of children: Not on file   Years of education: Not on file   Highest  education level: Not on file  Occupational History   Not on file  Tobacco Use   Smoking status: Never   Smokeless tobacco: Never  Vaping Use   Vaping Use: Never used  Substance and Sexual Activity   Alcohol use: No   Drug use: No   Sexual activity: Not Currently  Other Topics Concern   Not on file  Social History Narrative   Not on file   Social Determinants of Health   Financial Resource Strain: Not on file  Food Insecurity: Not on file  Transportation Needs: Not on file  Physical Activity: Not on file  Stress: Not on file  Social Connections: Not on file     Allergies:  No Known Allergies  Metabolic Disorder Labs: No results found for: HGBA1C, MPG No results found for: PROLACTIN No results found for: CHOL, TRIG, HDL, CHOLHDL, VLDL, LDLCALC No results found for: TSH  Therapeutic Level Labs: No results found for: LITHIUM No results found for: CBMZ No results found for: VALPROATE  Current Medications: Current Outpatient Medications  Medication Sig Dispense Refill   FLUoxetine (PROZAC) 10 MG capsule Take 3 capsules (30 mg total) by mouth daily. 270 capsule 0   FLUoxetine (PROZAC) 20 MG capsule Take 1 capsule (20 mg total) by mouth daily. Delete 10mg  capsules 30 capsule 1   amoxicillin (AMOXIL) 500 MG capsule Take 500 mg by mouth 3 (three) times daily. (Patient not taking:  Reported on 06/07/2021)     benzonatate (TESSALON) 100 MG capsule Take 1 capsule (100 mg total) by mouth every 8 (eight) hours. (Patient not taking: Reported on 06/07/2021) 21 capsule 0   buPROPion (WELLBUTRIN SR) 100 MG 12 hr tablet Take 1 tablet (100 mg total) by mouth daily. Take once a day not bid, delete prior refill 30 tablet 1   HYDROcodone-acetaminophen (NORCO/VICODIN) 5-325 MG per tablet Take 1 tablet by mouth every 6 (six) hours as needed for pain. (Patient not taking: Reported on 06/07/2021)     ondansetron (ZOFRAN ODT) 8 MG disintegrating tablet 8mg  ODT q8 hours prn nausea (Patient not  taking: Reported on 06/07/2021) 4 tablet 0   No current facility-administered medications for this visit.     Psychiatric Specialty Exam: Review of Systems  Cardiovascular:  Negative for chest pain.  Psychiatric/Behavioral:  Negative for agitation and self-injury.    Blood pressure 124/80, temperature 98.6 F (37 C), height 5' 9.5" (1.765 m), weight (!) 391 lb (177.4 kg).Body mass index is 56.91 kg/m.  General Appearance: Casual  Eye Contact: Fair  Speech:  Normal Rate  Volume:  Decreased  Mood: Dysphoric  Affect:  Congruent  Thought Process:  Goal Directed  Orientation:  Full (Time, Place, and Person)  Thought Content:  Rumination  Suicidal Thoughts:  No  Homicidal Thoughts:  No  Memory:  Immediate;   Fair Recent;   Fair  Judgement:  Fair  Insight:  Shallow  Psychomotor Activity:  Decreased  Concentration:  Concentration: Fair and Attention Span: Fair  Recall:  06/09/2021 of Knowledge:Good  Language: Good  Akathisia:  No  Handed:    AIMS (if indicated):  not done  Assets:  Desire for Improvement Leisure Time  ADL's:  Intact  Cognition: WNL  Sleep:  Fair   Screenings: PHQ2-9    Flowsheet Row Office Visit from 06/07/2021 in [facility] AT [place]  PHQ-2 Total Score 3  PHQ-9 Total Score 9      Flowsheet Row Video Visit from 03/08/2021 in [facility] AT [place]  C-SSRS RISK CATEGORY No Risk       Assessment and Plan: as follows Prior documentation reviewed  Other recurrent depressive disorder;  Endorses depression low self-esteem but currently not compliant with medication we discussed in detail about these appointments and to review medication and be compliant his agreement.  We will restart Prozac now at a dose of 20 mg but in addition I will also start Wellbutrin for this depression tiredness a motivation 100 mg is recommended to follow-up with therapy work on distraction He is not suicidal he  understands to call or call 911 in case if need to his mom is a good support we talked about distraction from the obsessions. Work is also good distraction increase it keeps him busy and help financially   Grief; endorses depression negative thoughts.  See above discussed compliance he agrees to start back on Prozac 20 mg on a regular basis we will add Wellbutrin strongly recommend regular therapy  Fu 1 m. In office Refill sent 03/10/2021, MD 6/23/202210:21 AM

## 2021-06-08 ENCOUNTER — Other Ambulatory Visit (HOSPITAL_COMMUNITY): Payer: Self-pay

## 2021-06-08 MED ORDER — BUPROPION HCL ER (SR) 100 MG PO TB12: 100.0000 mg | ORAL_TABLET | Freq: Every day | ORAL | 1 refills | Status: DC

## 2021-06-08 MED ORDER — FLUOXETINE HCL 20 MG PO CAPS: 20.0000 mg | ORAL_CAPSULE | Freq: Every day | ORAL | 1 refills | Status: AC

## 2021-07-03 ENCOUNTER — Ambulatory Visit (HOSPITAL_COMMUNITY): Payer: 59 | Admitting: Psychiatry

## 2021-07-05 ENCOUNTER — Other Ambulatory Visit: Payer: Self-pay

## 2021-07-05 ENCOUNTER — Telehealth (HOSPITAL_COMMUNITY): Payer: 59 | Admitting: Psychiatry

## 2021-07-05 ENCOUNTER — Ambulatory Visit (HOSPITAL_COMMUNITY): Payer: 59 | Admitting: Licensed Clinical Social Worker

## 2021-07-05 DIAGNOSIS — F338 Other recurrent depressive disorders: Secondary | ICD-10-CM

## 2021-07-06 NOTE — Progress Notes (Signed)
Virtual Visit via Video Note  I connected with Levi Rogers on 07/06/21 at  1:00 PM EDT by a video enabled telemedicine application and verified that I am speaking with the correct person using two identifiers.  Location: Patient: home Provider: Office   I discussed the limitations of evaluation and management by telemedicine and the availability of in person appointments. The patient expressed understanding and agreed to proceed.  THERAPIST PROGRESS NOTE  Session Time: 1:00 pm-1:40 pm  Type of Therapy: Individual Therapy   Session #16  Purpose of Session/Treatment Goals addressed:   Levi "Rogers" will manage mood as evidenced by more balance, increase trust with family, and change the pattern of his life for 5 out of 7 days for 60 days.  Interventions:  Therapist utilized CBT and Solution focused brief therapy to address mood and anxiety. Therapist provided support and empathy during session. Therapist had patient identify what has gone well since the last session. Therapist processed patient's feelings about his mother having a surgery to identify triggers.    Effectiveness: Patient was oriented x5 (person, place, situation, time, and object). Patient was casually dressed, and appropriately groomed. Patient was alert, engaged, pleasant, and cooperative during session. Patient noted that his mood has been up and down. Patient has been busy with work and is enjoying that. He spoke to his mother about using cam sites and she is handling his money. He is getting money for gas and will still use a small amount on cam sites. He is still going on cam sites even if he doesn't pay for time with a cam girl. Patient is trying to reduce that amount. He has been worried about his mother and a surgery she is having. He has had a distrust for doctors and hospitals since his father passed. Patient is going to take time off of work to care for his mother.   Patient engaged in session. He responded  well to interventions. Patient continues to meet criteria for Other recurrent depressive disorders and grief. Patient will continue in outpatient therapy due to being the least restrictive service to meet his needs. Patient made moderate progress on his goals.   Suicidal/Homicidal: Negativewithout intent/plan  Plan: Return again in 2-4 weeks.   Diagnosis: Axis I:  Other recurrent depressive disorders    Axis II: No diagnosis  I discussed the assessment and treatment plan with the patient. The patient was provided an opportunity to ask questions and all were answered. The patient agreed with the plan and demonstrated an understanding of the instructions.   The patient was advised to call back or seek an in-person evaluation if the symptoms worsen or if the condition fails to improve as anticipated.  I provided 40 minutes of non-face-to-face time during this encounter.  Levi Bellows, LCSW 07/06/2021

## 2021-07-18 ENCOUNTER — Encounter (HOSPITAL_COMMUNITY): Payer: Self-pay | Admitting: Psychiatry

## 2021-07-18 ENCOUNTER — Telehealth (INDEPENDENT_AMBULATORY_CARE_PROVIDER_SITE_OTHER): Payer: 59 | Admitting: Psychiatry

## 2021-07-18 DIAGNOSIS — F4321 Adjustment disorder with depressed mood: Secondary | ICD-10-CM

## 2021-07-18 DIAGNOSIS — F338 Other recurrent depressive disorders: Secondary | ICD-10-CM

## 2021-07-18 MED ORDER — BUPROPION HCL ER (SR) 100 MG PO TB12: 100.0000 mg | ORAL_TABLET | Freq: Every day | ORAL | 1 refills | Status: DC

## 2021-07-18 NOTE — Progress Notes (Signed)
[facility] Follow up visit   Patient Identification: Levi Rogers MRN:  [id number] Date of Evaluation:  07/18/2021 Referral Source: Therapist Chief Complaint: follow up  depression Visit Diagnosis:    ICD-10-CM   1. Other recurrent depressive disorders (HCC)  F33.8     2. Grief  F43.21      Virtual Visit via Telephone Note  I connected with [name] on 07/18/21 at 11:15 AM EDT by telephone and verified that I am speaking with the correct person using two identifiers.  Location: Patient: home Provider: home office   I discussed the limitations, risks, security and privacy concerns of performing an evaluation and management service by telephone and the availability of in person appointments. I also discussed with the patient that there may be a patient responsible charge related to this service. The patient expressed understanding and agreed to proceed.       I discussed the assessment and treatment plan with the patient. The patient was provided an opportunity to ask questions and all were answered. The patient agreed with the plan and demonstrated an understanding of the instructions.   The patient was advised to call back or seek an in-person evaluation if the symptoms worsen or if the condition fails to improve as anticipated.  I provided 11 minutes of non-face-to-face time during this encounter.      History of Present Illness: 25 years old currently single African-American male  Initially referred by his therapist for management and establishment of depression care.  Last visit we suggested to restart Prozac since he was noncompliant and increasing to 20 mg apparently still taking 10 mg so we discussed about compliance he does feel better more motivated to keep away from camp sites and is helping his mom recovering from surgery grandmother is helping them as well  He feels medication needs to be up Handling grief   He is doing therapy    Worries related to mom  health,  Aggravating factors; dad's death.  Bullied when young, feels lonely  Modifying factors,mom Duration few years  Severity of depression better   Past Psychiatric History: has felt subdued mood since young age, bullied in school  Previous Psychotropic Medications: No     Past Medical History: No past medical history on file.  Past Surgical History:  Procedure Laterality Date   wisdom teeth removal      Family Psychiatric History: depression  Family History:  Family History  Problem Relation Age of Onset   Anxiety disorder Mother    Depression Mother     Social History:   Social History   Socioeconomic History   Marital status: Single    Spouse name: Not on file   Number of children: Not on file   Years of education: Not on file   Highest education level: Not on file  Occupational History   Not on file  Tobacco Use   Smoking status: Never   Smokeless tobacco: Never  Vaping Use   Vaping Use: Never used  Substance and Sexual Activity   Alcohol use: No   Drug use: No   Sexual activity: Not Currently  Other Topics Concern   Not on file  Social History Narrative   Not on file   Social Determinants of Health   Financial Resource Strain: Not on file  Food Insecurity: Not on file  Transportation Needs: Not on file  Physical Activity: Not on file  Stress: Not on file  Social Connections: Not on file  Allergies:  No Known Allergies  Metabolic Disorder Labs: No results found for: HGBA1C, MPG No results found for: PROLACTIN No results found for: CHOL, TRIG, HDL, CHOLHDL, VLDL, LDLCALC No results found for: TSH  Therapeutic Level Labs: No results found for: LITHIUM No results found for: CBMZ No results found for: VALPROATE  Current Medications: Current Outpatient Medications  Medication Sig Dispense Refill   amoxicillin (AMOXIL) 500 MG capsule Take 500 mg by mouth 3 (three) times daily. (Patient not taking: Reported on 06/07/2021)      benzonatate (TESSALON) 100 MG capsule Take 1 capsule (100 mg total) by mouth every 8 (eight) hours. (Patient not taking: Reported on 06/07/2021) 21 capsule 0   buPROPion ER (WELLBUTRIN SR) 100 MG 12 hr tablet Take 1 tablet (100 mg total) by mouth daily. 30 tablet 1   FLUoxetine (PROZAC) 20 MG capsule Take 1 capsule (20 mg total) by mouth daily. Delete 10mg  capsules 30 capsule 1   HYDROcodone-acetaminophen (NORCO/VICODIN) 5-325 MG per tablet Take 1 tablet by mouth every 6 (six) hours as needed for pain. (Patient not taking: Reported on 06/07/2021)     ondansetron (ZOFRAN ODT) 8 MG disintegrating tablet 8mg  ODT q8 hours prn nausea (Patient not taking: Reported on 06/07/2021) 4 tablet 0   No current facility-administered medications for this visit.     Psychiatric Specialty Exam: Review of Systems  Cardiovascular:  Negative for chest pain.  Psychiatric/Behavioral:  Negative for agitation and self-injury.    There were no vitals taken for this visit.There is no height or weight on file to calculate BMI.  General Appearance:   Eye Contact:   Speech:  Normal Rate  Volume:  Decreased  Mood: Subdued  Affect:  Congruent  Thought Process:  Goal Directed  Orientation:  Full (Time, Place, and Person)  Thought Content:  Rumination  Suicidal Thoughts:  No  Homicidal Thoughts:  No  Memory:  Immediate;   Fair Recent;   Fair  Judgement:  Fair  Insight:  Shallow  Psychomotor Activity:  Decreased  Concentration:  Concentration: Fair and Attention Span: Fair  Recall:  of Knowledge:Good  Language: Good  Akathisia:  No  Handed:    AIMS (if indicated):  not done  Assets:  Desire for Improvement Leisure Time  ADL's:  Intact  Cognition: WNL  Sleep:  Fair   Screenings: PHQ2-9    Flowsheet Row Office Visit from 06/07/2021 in [facility] AT [place]  PHQ-2 Total Score 3  PHQ-9 Total Score 9      Flowsheet Row Video Visit from 07/18/2021 in [facility] AT [place] Video Visit from 03/08/2021 in [facility] AT [place]  C-SSRS RISK CATEGORY No Risk No Risk       Assessment and Plan: as follows Prior documentation reviewed  Other recurrent depressive disorder; somewhat subdued but better increase Prozac to 20 mg discussed compliance with the dose continue Wellbutrin which was started last visit 100 mg that is helped as well continue therapy    Grief; handling it better continue Prozac and increasing to 20 mg as above continue therapy  Follow-up in 1-1/2 to 2 months or earlier if needed   09/17/2021, MD 8/3/202211:23 AM

## 2021-07-25 ENCOUNTER — Ambulatory Visit (INDEPENDENT_AMBULATORY_CARE_PROVIDER_SITE_OTHER): Payer: 59 | Admitting: Licensed Clinical Social Worker

## 2021-07-25 DIAGNOSIS — F338 Other recurrent depressive disorders: Secondary | ICD-10-CM | POA: Diagnosis not present

## 2021-07-26 NOTE — Progress Notes (Signed)
Virtual Visit via Video Note  I connected with Levi Rogers on 07/26/21 at 11:00 AM EDT by a video enabled telemedicine application and verified that I am speaking with the correct person using two identifiers.  Location: Patient: home Provider: Office   I discussed the limitations of evaluation and management by telemedicine and the availability of in person appointments. The patient expressed understanding and agreed to proceed.  THERAPIST PROGRESS NOTE  Session Time: 11:00 pm-11:40 pm  Type of Therapy: Individual Therapy   Session #17  Purpose of Session/Treatment Goals addressed:   Levi "Levi Rogers" will manage mood as evidenced by more balance, increase trust with family, and change the pattern of his life for 5 out of 7 days for 60 days.  Interventions:  Therapist utilized CBT and Solution focused brief therapy to address mood and anxiety. Therapist provided support and empathy to patient during session. Therapist had patient identify pre-session change. Therapist worked with patient on identifying his frustrations, ways to communicate them without causing confrontations, and opening up to his family.    Effectiveness: Patient was oriented x5 (person, place, situation, time, and object). Patient was alert, engaged, pleasant, and cooperative. Patient noted that he has been doing ok. He has not been on cam sites to spend money and has been keeping himself busy with work. He has had some frustration at work where he feels like another Production designer, theatre/television/film is trying to take advantage of his kindness by asking him to switch shifts constantly and then will call into work. This increases the workload for patient and he has to work longer to cover for this manger. Patient is going to address it with the GM to resolve things. He feels like he holds onto his feelings for too long and they grow until he explodes. He is going to try to open up to family about how he is feeling and express his concerns  earlier to avoid getting very angry.   Patient engaged in session. He responded well to interventions. Patient continues to meet criteria for Other recurrent depressive disorders and grief. Patient will continue in outpatient therapy due to being the least restrictive service to meet his needs. Patient made moderate progress on his goals.   Suicidal/Homicidal: Negativewithout intent/plan  Plan: Return again in 2-4 weeks.   Diagnosis: Axis I:  Other recurrent depressive disorders    Axis II: No diagnosis  I discussed the assessment and treatment plan with the patient. The patient was provided an opportunity to ask questions and all were answered. The patient agreed with the plan and demonstrated an understanding of the instructions.   The patient was advised to call back or seek an in-person evaluation if the symptoms worsen or if the condition fails to improve as anticipated.  I provided 40 minutes of non-face-to-face time during this encounter.  Bynum Bellows, LCSW 07/26/2021

## 2021-07-30 ENCOUNTER — Other Ambulatory Visit (HOSPITAL_COMMUNITY): Payer: Self-pay

## 2021-07-30 MED ORDER — BUPROPION HCL ER (SR) 100 MG PO TB12: 100.0000 mg | ORAL_TABLET | Freq: Every day | ORAL | 1 refills | Status: AC

## 2021-09-12 ENCOUNTER — Ambulatory Visit (HOSPITAL_COMMUNITY): Payer: 59 | Admitting: Licensed Clinical Social Worker

## 2021-09-19 ENCOUNTER — Telehealth (HOSPITAL_COMMUNITY): Payer: 59 | Admitting: Psychiatry

## 2021-10-03 ENCOUNTER — Ambulatory Visit (HOSPITAL_COMMUNITY): Payer: 59 | Admitting: Licensed Clinical Social Worker

## 2021-10-03 ENCOUNTER — Other Ambulatory Visit: Payer: Self-pay

## 2022-07-06 IMAGING — DX DG CHEST 1V PORT
1 series · 1 of 1 positions shown · non-contrast
Comparison: None.

CLINICAL DATA: Cough

EXAM:
PORTABLE CHEST 1 VIEW

[chest ap]
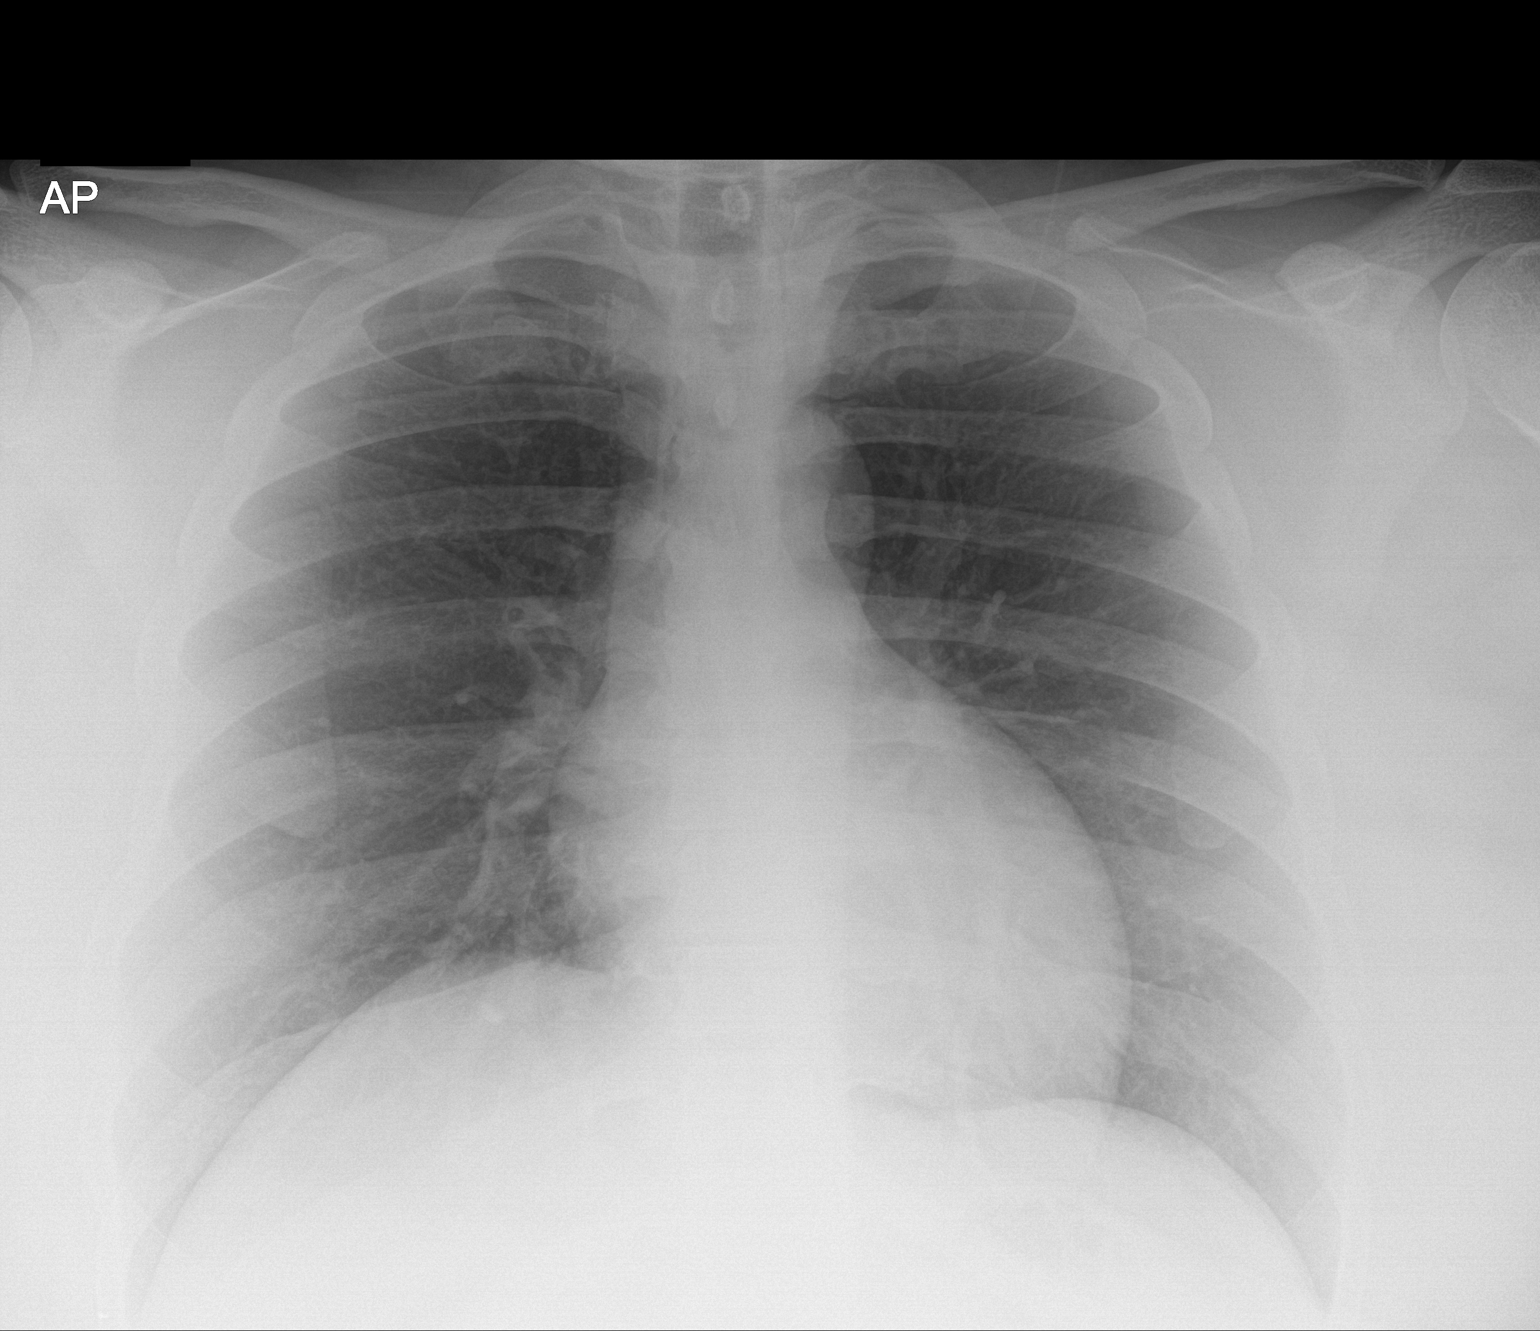

[1 of 1 positions shown; findings below may reference images not displayed]

FINDINGS: The heart size and mediastinal contours are within normal limits.
Both lungs are clear. The visualized skeletal structures are
unremarkable.
IMPRESSION: No active disease.

## 2024-03-30 ENCOUNTER — Ambulatory Visit (INDEPENDENT_AMBULATORY_CARE_PROVIDER_SITE_OTHER): Admitting: Licensed Clinical Social Worker

## 2024-03-30 ENCOUNTER — Encounter (HOSPITAL_COMMUNITY): Payer: Self-pay

## 2024-03-30 DIAGNOSIS — F331 Major depressive disorder, recurrent, moderate: Secondary | ICD-10-CM | POA: Diagnosis not present

## 2024-03-30 NOTE — Progress Notes (Signed)
 Comprehensive Clinical Assessment (CCA) Note  03/30/2024 Levi Rogers [id number]  Chief Complaint:  Chief Complaint  Patient presents with   Depression   Visit Diagnosis: Major depressive disorder, recurrent episode, moderate with anxious distress (HCC)    CCA Biopsychosocial Intake/Chief Complaint:  uncontrollable urges, transitions  Current Symptoms/Problems: Mood: irritability, concerned about weight but limited appetite, some difficulty with concentration, limited friends, lack of motivation at times, increase in sexual thoughts and urges, difficulty transitioning to adulthood, feelings of worthlessness, some feelings of hopelessness, doesn't feel like he is a happy person, feels empty at times   Patient Reported Schizophrenia/Schizoaffective Diagnosis in Past: No   Strengths: Counselling psychologist, likes to help out, good in Retail buyer and psychology, works well with others  Preferences: prefers peace and quiet, prefers to work by himself, doesn't prefer to be around people too much  Abilities: Good in Retail buyer, is a Production designer, theatre/television/film at work   Type of Services Patient Feels are Needed: Therapy   Initial Clinical Notes/Concerns: Symptoms started in middle school when he was bullied about weight, symptoms occur daily for impulses and every few days for "hard depression," symptoms are moderate to severe   Mental Health Symptoms Depression:  Irritability; Difficulty Concentrating; Worthlessness; Hopelessness   Duration of Depressive symptoms: No data recorded  Mania:  None   Anxiety:   Irritability; Worrying   Psychosis:  None   Duration of Psychotic symptoms: No data recorded  Trauma:  None   Obsessions:  None   Compulsions:  None   Inattention:  None   Hyperactivity/Impulsivity:  N/A   Oppositional/Defiant Behaviors:  None   Emotional Irregularity:  None   Other Mood/Personality Symptoms:  N/A    Mental Status Exam Appearance and self-care  Stature:  Average   Weight:   Overweight   Clothing:  Casual   Grooming:  Normal   Cosmetic use:  None   Posture/gait:  Normal   Motor activity:  Not Remarkable   Sensorium  Attention:  Normal   Concentration:  Normal   Orientation:  X5   Recall/memory:  Normal   Affect and Mood  Affect:  Appropriate   Mood:  Depressed   Relating  Eye contact:  Fleeting   Facial expression:  Depressed; Responsive   Attitude toward examiner:  Cooperative   Thought and Language  Speech flow: Normal   Thought content:  Appropriate to Mood and Circumstances   Preoccupation:  No data recorded  Hallucinations:  None   Organization:  No data recorded  [employer] of Knowledge:  Good   Intelligence:  Average   Abstraction:  Normal   Judgement:  Fair   Dance movement psychotherapist:  Adequate   Insight:  Good   Decision Making:  Normal   Social Functioning  Social Maturity:  Isolates   Social Judgement:  Normal   Stress  Stressors:  Transitions   Coping Ability:  Human resources officer Deficits:  Self-control   Supports:  Family     Religion: Religion/Spirituality Are You A Religious Person?: Yes What is Your Religious Affiliation?: Christian How Might This Affect Treatment?: Support in treatment  Leisure/Recreation: Leisure / Recreation Do You Have Hobbies?: Yes Leisure and Hobbies: Movies, spend time with mom and sibling  Exercise/Diet: Exercise/Diet Do You Exercise?: No Have You Gained or Lost A Significant Amount of Weight in the Past Six Months?: No Do You Follow a Special Diet?: No Do You Have Any Trouble Sleeping?: No   CCA Employment/Education Employment/Work Situation: Employment / Work  Situation Employment Situation: Unemployed Patient's Job has Been Impacted by Current Illness: No What is the Longest Time Patient has Held a Job?: 9 years Where was the Patient Employed at that Time?: [employer] Has Patient ever Been in the [employer]?: No  Education: Education Is Patient  Currently Attending School?: No Last Grade Completed: 12 Name of High School: [employer] Did Garment/textile technologist From [employer]?: Yes Did Theme park manager?: No (Some college) Did You Attend Graduate School?: No Did You Have Any Special Interests In School?: Science, and Psychology Did You Have An Individualized Education Program (IIEP): Yes (seperate rooms for testing) Did You Have Any Difficulty At School?: Yes Patient's Education Has Been Impacted by Current Illness: No   CCA Family/Childhood History Family and Relationship History: Family history Marital status: Single Are you sexually active?: No What is your sexual orientation?: Heterosexual Has your sexual activity been affected by drugs, alcohol, medication, or emotional stress?: N/A Does patient have children?: No  Childhood History:  Childhood History By whom was/is the patient raised?: Both parents Additional childhood history information: Both parents were in the home. Father passed away from covid19 last year. Patient describes childhood as good until middle school and then it got bad due to bullying Description of patient's relationship with caregiver when they were a child: Mother: Good, Father: good but got stressful at times Patient's description of current relationship with people who raised him/her: Mother: good, Father: deceased How were you disciplined when you got in trouble as a child/adolescent?: spanked a few times Does patient have siblings?: Yes Number of Siblings: 1 Description of patient's current relationship with siblings: Brother: a little closer Did patient suffer any verbal/emotional/physical/sexual abuse as a child?: No Did patient suffer from severe childhood neglect?: No Has patient ever been sexually abused/assaulted/raped as an adolescent or adult?: No Was the patient ever a victim of a crime or a disaster?: No Witnessed domestic violence?: No Has patient been affected by domestic  violence as an adult?: No  Child/Adolescent Assessment:     CCA Substance Use Alcohol/Drug Use: Alcohol / Drug Use Pain Medications: See patient MAR Prescriptions: See patient MAR Over the Counter: See patient MAR History of alcohol / drug use?: No history of alcohol / drug abuse                         ASAM's:  Six Dimensions of Multidimensional Assessment  Dimension 1:  Acute Intoxication and/or Withdrawal Potential:   Dimension 1:  Description of individual's past and current experiences of substance use and withdrawal: None  Dimension 2:  Biomedical Conditions and Complications:   Dimension 2:  Description of patient's biomedical conditions and  complications: None  Dimension 3:  Emotional, Behavioral, or Cognitive Conditions and Complications:  Dimension 3:  Description of emotional, behavioral, or cognitive conditions and complications: None  Dimension 4:  Readiness to Change:  Dimension 4:  Description of Readiness to Change criteria: None  Dimension 5:  Relapse, Continued use, or Continued Problem Potential:  Dimension 5:  Relapse, continued use, or continued problem potential critiera description: None  Dimension 6:  Recovery/Living Environment:  Dimension 6:  Recovery/Iiving environment criteria description: None  ASAM Severity Score: ASAM's Severity Rating Score: 0  ASAM Recommended Level of Treatment:     Substance use Disorder (SUD)    Recommendations for Services/Supports/Treatments: Recommendations for Services/Supports/Treatments Recommendations For Services/Supports/Treatments: Individual Therapy, Medication Management  DSM5 Diagnoses: There are no active problems to display for  this patient.   Patient Centered Plan: Patient is on the following Treatment Plan(s):  Depression   Referrals to Alternative Service(s): Referred to Alternative Service(s):   Place:   Date:   Time:    Referred to Alternative Service(s):   Place:   Date:   Time:     Referred to Alternative Service(s):   Place:   Date:   Time:    Referred to Alternative Service(s):   Place:   Date:   Time:      Collaboration of Care: Other To be identified  Patient/Guardian was advised Release of Information must be obtained prior to any record release in order to collaborate their care with an outside provider. Patient/Guardian was advised if they have not already done so to contact the registration department to sign all necessary forms in order for us  to release information regarding their care.   Consent: Patient/Guardian gives verbal consent for treatment and assignment of benefits for services provided during this visit. Patient/Guardian expressed understanding and agreed to proceed.   Braxton Calico, LCSW

## 2024-04-15 ENCOUNTER — Ambulatory Visit (HOSPITAL_COMMUNITY): Admitting: Licensed Clinical Social Worker

## 2024-04-15 DIAGNOSIS — F331 Major depressive disorder, recurrent, moderate: Secondary | ICD-10-CM

## 2024-04-15 NOTE — Progress Notes (Signed)
  THERAPIST PROGRESS NOTE  Session Time: 1:00 pm-1:45 pm  Type of Therapy: Individual Therapy   Session #1  Purpose of Session/Treatment Goals addressed:  [name] will manage mood as evidenced by balancing emotions, reducing addictive behavior, improving sleep, and lose weight for 5 out of 7 days for 60 days.    Interventions: Therapist utilized CBT and Solution focused brief therapy to address mood and anxiety. Therapist provided support and empathy to patient during session. Therapist administered the PHQ9 and GAD7. Therapist provided psychoeducation on CBT. Therapist worked with patient to identify short term goals for his physical health, emotional health, and his sleep.   Effectiveness: Patient was oriented x5 (person, place, situation, time, and object). Patient was alert, engaged, pleasant, and cooperative. Patient completed the PHQ9 and GAD7. Patient noted that things have been up and down since the last session. Patient has felt down and frustrated. He continues to think about losing his job. He has also had back pain which has impacted how he feels physically and emotionally. Patient is still looking for a job. Patient understood the CBT triangle. Patient is going to focus on getting to bed at midnight rather than 2 or 3 am so that he can wake up by 9 am. Patient has been walking 2 days a week, drinking more water, and eating vegetables. He is going to add one more day of walking. Patient is going to work on his emotions by tracking his thoughts and feelings.   Patient engaged in session. He responded well to interventions. Patient continues to meet criteria for Major depressive disorder, recurrent episode, moderate with anxious distress [facility])  Patient will continue in outpatient therapy due to being the least restrictive service to meet his needs. Patient made minimal progress on his goals.   Suicidal/Homicidal: Negativewithout intent/plan  Plan: Return again in 2-4 weeks.    Diagnosis: Axis I: Major depressive disorder, recurrent episode, moderate with anxious distress (HCC)     Axis II: No diagnosis  I discussed the assessment and treatment plan with the patient. The patient was provided an opportunity to ask questions and all were answered. The patient agreed with the plan and demonstrated an understanding of the instructions.    Braxton Calico, LCSW 04/15/2024

## 2024-05-18 ENCOUNTER — Ambulatory Visit (INDEPENDENT_AMBULATORY_CARE_PROVIDER_SITE_OTHER): Admitting: Licensed Clinical Social Worker

## 2024-05-18 DIAGNOSIS — F331 Major depressive disorder, recurrent, moderate: Secondary | ICD-10-CM | POA: Diagnosis not present

## 2024-05-18 NOTE — Progress Notes (Unsigned)
  THERAPIST PROGRESS NOTE  Session Time: 2:55 pm-3:35 pm  Type of Therapy: Individual Therapy   Session #2  Purpose of Session/Treatment Goals addressed:  Levi Rogers will manage mood as evidenced by balancing emotions, reducing addictive behavior, improving sleep, and lose weight for 5 out of 7 days for 60 days.    Interventions: Therapist utilized CBT and Solution focused brief therapy to address mood and anxiety. Therapist provided support and empathy to patient during session. Therapist administered the PHQ9 and GAD7.   Effectiveness: Patient was oriented x5 (person, place, situation, time, and object). Patient was alert, engaged, pleasant, and cooperative. Patient completed the PHQ9 and GAD7.   Patient engaged in session. He responded well to interventions. Patient continues to meet criteria for Major depressive disorder, recurrent episode, moderate with anxious distress [facility])  Patient will continue in outpatient therapy due to being the least restrictive service to meet his needs. Patient made minimal progress on his goals.      05/18/2024    2:58 PM 04/15/2024    1:07 PM 03/30/2024   12:29 PM 06/07/2021    9:52 AM  Depression screen PHQ 2/9  Decreased Interest 2 2 2 2   Down, Depressed, Hopeless 2 2 2 1   PHQ - 2 Score 4 4 4 3   Altered sleeping 3 2 2  0  Tired, decreased energy 2 2 2 1   Change in appetite 1 1 2 1   Feeling bad or failure about yourself  2 2 2 3   Trouble concentrating 1 0 1 0  Moving slowly or fidgety/restless 1 0 1 0  Suicidal thoughts 0 0 0 1  PHQ-9 Score 14 11 14 9   Difficult doing work/chores   Very difficult        05/18/2024    2:59 PM 04/15/2024    1:07 PM  GAD 7 : Generalized Anxiety Score  Nervous, Anxious, on Edge 1 1  Control/stop worrying 2 3  Worry too much - different things 2 2  Trouble relaxing 3 0  Restless 1 0  Easily annoyed or irritable 1 1  Afraid - awful might happen 1 1  Total GAD 7 Score 11 8  Anxiety Difficulty Somewhat difficult Somewhat  difficult      Suicidal/Homicidal: Negativewithout intent/plan  Plan: Return again in 2-4 weeks.   Diagnosis: Axis I: Major depressive disorder, recurrent episode, moderate with anxious distress (HCC)     Axis II: No diagnosis  I discussed the assessment and treatment plan with the patient. The patient was provided an opportunity to ask questions and all were answered. The patient agreed with the plan and demonstrated an understanding of the instructions.    Braxton Calico, LCSW 05/18/2024

## 2024-06-02 ENCOUNTER — Ambulatory Visit (INDEPENDENT_AMBULATORY_CARE_PROVIDER_SITE_OTHER): Admitting: Licensed Clinical Social Worker

## 2024-06-02 DIAGNOSIS — F331 Major depressive disorder, recurrent, moderate: Secondary | ICD-10-CM

## 2024-06-02 NOTE — Progress Notes (Signed)
 THERAPIST PROGRESS NOTE  Session Time: 10:00 am-10:45 am  Type of Therapy: Individual Therapy   Session #3  Purpose of Session/Treatment Goals addressed:  Levi Rogers will manage mood as evidenced by balancing emotions, reducing addictive behavior, improving sleep, and lose weight for 5 out of 7 days for 60 days.    Interventions: Therapist utilized CBT and Solution focused brief therapy to address mood and anxiety. Therapist provided support and empathy to patient during session. Therapist followed up on patient's thoughts, feelings, and behaviors. Therapist worked with patient on cognitive distortions and how he can label his thinking.   Effectiveness: Patient was oriented x5 (person, place, situation, time, and object). Patient was alert, engaged, pleasant, and cooperative. Patient noted that he was feeling a little better since last time. He has been feeling ok and there has been nothing major that impacted his mood. He understood cognitive distortions and recognized that he catastrophizes and emotional reasoning for situations which impact his mood. He elaborated and explained that if he feels like a bad person or he lost who he is then he will feel down. Patient recognized that his thoughts don't always line up with reality. Patient understood that labeling the thinking can help his shift his thinking. Patient describes his mood as a dark hole that he keeps going deeper in. He was able to identify a movie called 127 hours about a climber who got his arm stuck between a boulder then had to remove his arm with a pocket knife to get out. Patient is going to label his negative thinking as his boulder thinking because he gets him stuck.   Patient engaged in session. He responded well to interventions. Patient continues to meet criteria for Major depressive disorder, recurrent episode, moderate with anxious distress [facility])  Patient will continue in outpatient therapy due to being the least  restrictive service to meet his needs. Patient made minimal progress on his goals.      05/18/2024    2:58 PM 04/15/2024    1:07 PM 03/30/2024   12:29 PM 06/07/2021    9:52 AM  Depression screen PHQ 2/9  Decreased Interest 2 2 2 2   Down, Depressed, Hopeless 2 2 2 1   PHQ - 2 Score 4 4 4 3   Altered sleeping 3 2 2  0  Tired, decreased energy 2 2 2 1   Change in appetite 1 1 2 1   Feeling bad or failure about yourself  2 2 2 3   Trouble concentrating 1 0 1 0  Moving slowly or fidgety/restless 1 0 1 0  Suicidal thoughts 0 0 0 1  PHQ-9 Score 14 11 14 9   Difficult doing work/chores   Very difficult        05/18/2024    2:59 PM 04/15/2024    1:07 PM  GAD 7 : Generalized Anxiety Score  Nervous, Anxious, on Edge 1 1  Control/stop worrying 2 3  Worry too much - different things 2 2  Trouble relaxing 3 0  Restless 1 0  Easily annoyed or irritable 1 1  Afraid - awful might happen 1 1  Total GAD 7 Score 11 8  Anxiety Difficulty Somewhat difficult Somewhat difficult      Suicidal/Homicidal: Negativewithout intent/plan  Plan: Return again in 2-4 weeks.   Diagnosis: Axis I: Major depressive disorder, recurrent episode, moderate with anxious distress (HCC)     Axis II: No diagnosis  I discussed the assessment and treatment plan with the patient. The patient was provided an opportunity  to ask questions and all were answered. The patient agreed with the plan and demonstrated an understanding of the instructions.    Braxton Calico, LCSW 06/02/2024

## 2024-07-12 ENCOUNTER — Ambulatory Visit (HOSPITAL_COMMUNITY): Admitting: Licensed Clinical Social Worker

## 2024-07-13 ENCOUNTER — Ambulatory Visit (HOSPITAL_COMMUNITY): Admitting: Licensed Clinical Social Worker

## 2024-07-28 ENCOUNTER — Ambulatory Visit (HOSPITAL_COMMUNITY): Admitting: Licensed Clinical Social Worker

## 2024-08-04 ENCOUNTER — Ambulatory Visit (HOSPITAL_COMMUNITY): Admitting: Licensed Clinical Social Worker

## 2024-08-20 ENCOUNTER — Ambulatory Visit (INDEPENDENT_AMBULATORY_CARE_PROVIDER_SITE_OTHER): Admitting: Licensed Clinical Social Worker

## 2024-08-20 DIAGNOSIS — F331 Major depressive disorder, recurrent, moderate: Secondary | ICD-10-CM

## 2024-08-20 NOTE — Progress Notes (Addendum)
 THERAPIST PROGRESS NOTE  Session Time: 9:00 am-9:45 am  Type of Therapy: Individual Therapy   Session #3  Treatment Goals:  Levi Rogers will manage mood as evidenced by balancing emotions, reducing addictive behavior, improving sleep, and lose weight for 5 out of 7 days for 60 days.    Session Goal: Balance emotions, lose weight  Behavior: Patient noted that he has been feeling ok but has been feeling depressed. Patient has been helping his family out with errands and his mother is recovering well from her surgery over the summer. Patient was oriented x5 (person, place, situation, time, and object). Patient was alert, engaged, pleasant, and cooperative.  Patient made minimal progress on his goals.   Interventions: Therapist discussed patient's expectations for therapy, expectations for therapist, therapist expectations for patient, and how he will know when therapy is no longer needed. Therapist used CBT interventions of cognitive restructuring to challenge negative thoughts. Therapist used solution focused brief therapy to be more future oriented and identify small goals.   Response: Patient expects to get help with problems in therapy, for the therapist to be open, non judgmental, and provide coping skills, patient understood therapist expectations for him, and feels like when he is able to cope better he will no longer need therapy as often. Patient feels like he is almost 30 and has not accomplished any goals. He wants to get healthy, get a job, find a place of his own, and maybe find a relationship. Patient has been prescribed Wegovy and will be starting today. He knows he needs to change how he eats and start to exercise. He is going to start on Monday. Patient will be working on his future goals in the present starting with his health. Patient agreed to identify a exercise program for 3 days a week and start the gym on Monday. This will change how he views himself and change his thoughts.  No  psychosis, and no HI/SI present.   Patient engaged in session. He responded well to interventions. Patient continues to meet criteria for Major depressive disorder, recurrent episode, moderate with anxious distress [facility])  Patient will continue in outpatient therapy due to being the least restrictive service to meet his needs.   Plan: Return again in 2-4 weeks  Suicidal/Homicidal: Negativewithout intent/plan     05/18/2024    2:58 PM 04/15/2024    1:07 PM 03/30/2024   12:29 PM 06/07/2021    9:52 AM  Depression screen PHQ 2/9  Decreased Interest 2 2 2 2   Down, Depressed, Hopeless 2 2 2 1   PHQ - 2 Score 4 4 4 3   Altered sleeping 3 2 2  0  Tired, decreased energy 2 2 2 1   Change in appetite 1 1 2 1   Feeling bad or failure about yourself  2 2 2 3   Trouble concentrating 1 0 1 0  Moving slowly or fidgety/restless 1 0 1 0  Suicidal thoughts 0 0 0 1  PHQ-9 Score 14 11 14 9   Difficult doing work/chores   Very difficult        05/18/2024    2:59 PM 04/15/2024    1:07 PM  GAD 7 : Generalized Anxiety Score  Nervous, Anxious, on Edge 1 1  Control/stop worrying 2 3  Worry too much - different things 2 2  Trouble relaxing 3 0  Restless 1 0  Easily annoyed or irritable 1 1  Afraid - awful might happen 1 1  Total GAD 7 Score 11 8  Anxiety  Difficulty Somewhat difficult Somewhat difficult    I discussed the assessment and treatment plan with the patient. The patient was provided an opportunity to ask questions and all were answered. The patient agreed with the plan and demonstrated an understanding of the instructions.    Fonda Conroy, LCSW 08/20/2024

## 2024-09-16 ENCOUNTER — Ambulatory Visit (HOSPITAL_COMMUNITY): Admitting: Licensed Clinical Social Worker

## 2024-10-12 ENCOUNTER — Ambulatory Visit (INDEPENDENT_AMBULATORY_CARE_PROVIDER_SITE_OTHER): Admitting: Licensed Clinical Social Worker

## 2024-10-12 DIAGNOSIS — F331 Major depressive disorder, recurrent, moderate: Secondary | ICD-10-CM

## 2024-10-12 NOTE — Progress Notes (Unsigned)
  THERAPIST PROGRESS NOTE  Session Time: 9:00 am-9:45 am  Type of Therapy: Individual Therapy   Session #3  Treatment Goals:  [name] will manage mood as evidenced by balancing emotions, reducing addictive behavior, improving sleep, and lose weight for 5 out of 7 days for 60 days.    Session Goal: Balance emotions, lose weight  Behavior:  Patient was oriented x5 (person, place, situation, time, and object). Patient was alert, engaged, pleasant, and cooperative.  Patient made minimal progress on his goals.   Interventions:  Patient agreed to identify a exercise program for 3 days a week and start the gym on Monday. This will change how he views himself and change his thoughts.  No psychosis, and no HI/SI present.   Patient engaged in session. He responded well to interventions. Patient continues to meet criteria for No diagnosis found.  Patient will continue in outpatient therapy due to being the least restrictive service to meet his needs.   Plan: Return again in 2-4 weeks  Suicidal/Homicidal: Negativewithout intent/plan     05/18/2024    2:58 PM 04/15/2024    1:07 PM 03/30/2024   12:29 PM 06/07/2021    9:52 AM  Depression screen PHQ 2/9  Decreased Interest 2 2 2 2   Down, Depressed, Hopeless 2 2 2 1   PHQ - 2 Score 4 4 4 3   Altered sleeping 3 2 2  0  Tired, decreased energy 2 2 2 1   Change in appetite 1 1 2 1   Feeling bad or failure about yourself  2 2 2 3   Trouble concentrating 1 0 1 0  Moving slowly or fidgety/restless 1 0 1 0  Suicidal thoughts 0 0 0 1  PHQ-9 Score 14 11 14 9   Difficult doing work/chores   Very difficult        05/18/2024    2:59 PM 04/15/2024    1:07 PM  GAD 7 : Generalized Anxiety Score  Nervous, Anxious, on Edge 1 1  Control/stop worrying 2 3  Worry too much - different things 2 2  Trouble relaxing 3 0  Restless 1 0  Easily annoyed or irritable 1 1  Afraid - awful might happen 1 1  Total GAD 7 Score 11 8  Anxiety Difficulty Somewhat difficult Somewhat  difficult    I discussed the assessment and treatment plan with the patient. The patient was provided an opportunity to ask questions and all were answered. The patient agreed with the plan and demonstrated an understanding of the instructions.    Fonda Conroy, LCSW 10/12/2024
